# Patient Record
Sex: Female | Born: 1995 | Hispanic: No | Marital: Single | Smoking: Never smoker
Health system: Southern US, Community
[De-identification: ages and names within clinical notes are randomized; demographics above are authoritative.]

## PROBLEM LIST (undated history)

## (undated) DIAGNOSIS — S62101A Fracture of unspecified carpal bone, right wrist, initial encounter for closed fracture: Secondary | ICD-10-CM

## (undated) DIAGNOSIS — L309 Dermatitis, unspecified: Secondary | ICD-10-CM

## (undated) DIAGNOSIS — Z7189 Other specified counseling: Secondary | ICD-10-CM

## (undated) DIAGNOSIS — J020 Streptococcal pharyngitis: Secondary | ICD-10-CM

## (undated) DIAGNOSIS — R519 Headache, unspecified: Secondary | ICD-10-CM

## (undated) DIAGNOSIS — R51 Headache: Secondary | ICD-10-CM

## (undated) DIAGNOSIS — C73 Malignant neoplasm of thyroid gland: Secondary | ICD-10-CM

## (undated) DIAGNOSIS — C801 Malignant (primary) neoplasm, unspecified: Secondary | ICD-10-CM

## (undated) HISTORY — DX: Other specified counseling: Z71.89

## (undated) HISTORY — DX: Dermatitis, unspecified: L30.9

## (undated) HISTORY — DX: Streptococcal pharyngitis: J02.0

---
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
## 2016-05-26 ENCOUNTER — Other Ambulatory Visit: Payer: Self-pay | Admitting: Infectious Disease

## 2016-05-26 DIAGNOSIS — L0292 Furuncle, unspecified: Secondary | ICD-10-CM

## 2016-12-15 ENCOUNTER — Ambulatory Visit
Admission: RE | Admit: 2016-12-15 | Discharge: 2016-12-15 | Disposition: A | Discharge status: Home / Self Care | Payer: Medicaid Other | Source: Ambulatory Visit | Attending: Family Medicine | Admitting: Family Medicine

## 2016-12-15 ENCOUNTER — Other Ambulatory Visit: Payer: Self-pay | Admitting: Family Medicine

## 2016-12-15 DIAGNOSIS — R221 Localized swelling, mass and lump, neck: Secondary | ICD-10-CM

## 2016-12-15 MED ORDER — IOPAMIDOL (ISOVUE-300) INJECTION 61%
75.0000 mL | Freq: Once | INTRAVENOUS | Status: AC | PRN
  Administered 2016-12-15: 75 mL via INTRAVENOUS

## 2016-12-16 ENCOUNTER — Other Ambulatory Visit (HOSPITAL_COMMUNITY): Payer: Self-pay | Admitting: Family Medicine

## 2016-12-16 DIAGNOSIS — E042 Nontoxic multinodular goiter: Secondary | ICD-10-CM

## 2016-12-20 ENCOUNTER — Ambulatory Visit (HOSPITAL_COMMUNITY)
Admission: RE | Admit: 2016-12-20 | Discharge: 2016-12-20 | Disposition: A | Discharge status: Home / Self Care | Payer: BC Managed Care – PPO | Source: Ambulatory Visit | Attending: Family Medicine | Admitting: Family Medicine

## 2016-12-20 DIAGNOSIS — E042 Nontoxic multinodular goiter: Secondary | ICD-10-CM | POA: Insufficient documentation

## 2016-12-28 ENCOUNTER — Ambulatory Visit (HOSPITAL_COMMUNITY)
Admission: RE | Admit: 2016-12-28 | Discharge: 2016-12-28 | Disposition: A | Discharge status: Home / Self Care | Payer: BC Managed Care – PPO | Source: Ambulatory Visit | Attending: Family Medicine | Admitting: Family Medicine

## 2016-12-28 DIAGNOSIS — E042 Nontoxic multinodular goiter: Secondary | ICD-10-CM | POA: Insufficient documentation

## 2016-12-28 MED ORDER — LIDOCAINE HCL 1 % IJ SOLN
INTRAMUSCULAR | Status: AC
  Filled 2016-12-28: qty 20

## 2016-12-28 NOTE — Procedures (Signed)
US guided FNA X4 right inferior thyroid solid/cystic nodule and FNA x4 dominant left thyroid nodule via 25 gauge needles. Medication used- 1% lidocaine to skin/SQ tissue. No immediate complications. Final pathology pending.

## 2017-01-05 NOTE — H&P (Signed)
Otolaryngology Clinic Note  HPI:    Heather Glass is a 21 y.o. 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  She has had a somewhat thick lower neck for many years.  No fluctuation and no symptoms.  She had an infection after wisdom tooth extraction 2 years ago and feels like the area was slightly more full after that.  3 weeks ago, she noticed rather sudden increased swelling.  A CT scan of the neck showed a large left thyroid lesion, a possible left neck level 4 node, and a smaller right thyroid lesion.  Ultrasound showed the same.  Ultrasound-guided needle aspiration showed uncertain result on the right, papillary carcinoma on the left.  Outside of the appearance, she has no symptoms.  No pain.  No difficulty breathing or swallowing.  No hoarseness.  No other lumps or bumps in her neck.  She has a maternal grandmother who had thyroid cancer.  No history of radiation exposure.  She is otherwise completely healthy.  PMH/Meds/All/SocHx/FamHx/ROS:   History reviewed. No pertinent past medical history.  Past Surgical History:  Procedure Laterality Date  . biopsy thyroid    . MOUTH SURGERY      No family history of bleeding disorders, wound healing problems or difficulty with anesthesia.   Social History   Social History  . Marital status: N/A    Spouse name: N/A  . Number of children: N/A  . Years of education: N/A   Occupational History  . Not on file.   Social History Main Topics  . Smoking status: Never Smoker  . Smokeless tobacco: Never Used  . Alcohol use Not on file  . Drug use: Unknown  . Sexual activity: Not on file   Other Topics Concern  . Not on file   Social History Narrative  . No narrative on file     Current Outpatient Prescriptions:  .  calcium-vitamin D (OYSTER SHELL CALCIUM-VIT D3) 500 mg(1,250mg ) -200 unit per tablet, Take 2 tablets by mouth 2 times daily with meals for 10 days., Disp: 40 tablet, Rfl: 1 .   HYDROcodone-acetaminophen (NORCO) 5-325 mg per tablet, Take 1-2 tablets by mouth every 4 (four) hours as needed for up to 7 days for Pain., Disp: 30 tablet, Rfl: 0 .  levothyroxine (SYNTHROID) 100 MCG tablet, Take 1 tablet (100 mcg total) by mouth daily for 30 days., Disp: 30 tablet, Rfl: 5  A complete ROS was performed with pertinent positives/negatives noted in the HPI. The remainder of the ROS are negative.    Physical Exam:    BP 109/69 (Site: Left arm, Position: Sitting)   Ht 1.6 m (5\' 3" )   Wt 67.6 kg (149 lb)   BMI 26.39 kg/m  She is pleasant and healthy.  Mental status is appropriate.  She hears well in conversational speech.  Voice is clear and respirations unlabored through the nose.  The head is atraumatic and neck supple.  Cranial nerves intact.  Ear canals are clear with teeth in good repair.  External nose shows a complex deformity of the dorsum, and heavy buckling of the nasal septum into the right side with very prominent left maxillary crest spurring.  No polyps or active drainage.  Oral cavity shows teeth in excellent repair.  Oropharynx is clear.  I could not adequately evaluate her vocal cords with mirror examination.  Neck with a large moderately firm left thyroid mass.  Nontender.  No overlying skin changes.  No palpable adenopathy.  Negative Chvostek sign. Lungs: Clear to auscultation Heart: Regular rate and rhythm without murmurs Abdomen: Soft, active Extremities: Normal configuration Neurologic: Symmetric, grossly intact.  I will  Using the flexible laryngoscope, the nasopharynx is clear.  Oropharynx is clear.  Hypopharynx/larynx clear with mobile vocal cords and good airway.  No pooling in valleculae or piriform.  I could see well down the trachea from above and did not see any signs of tracheal invasion.    Flexible Laryngoscopy   Indications were discussed.  Details of the procedure were explained.  Questions were answered and informed consent was obtained  verbally.    Technique:  After anesthetizing the nasal cavity with topical lidocaine and oxymetazoline, the flexible endoscope was introduced and passed through the right nasal cavity into the nasopharynx. The scope was withdrawn from the nose. She tolerated the procedure well.     Impression & Plans:   Papillary carcinoma of the left thyroid.  Possible multifocal lesion of the right thyroid.  Left neck level 4 suspicious lymph node.  Distorted/deformed nasal dorsum and nasal septum with partial obstruction on each side.  Plan: She needs a total thyroidectomy and limited neck dissection.  I discussed the surgery in detail including risks and complications.  Questions were answered and informed consent was obtained.  Postoperative prescriptions for hydrocodone, Synthroid, and Os-Cal written and given.  I will see her back one week after surgery.  She can go back to class after 10 days, but no strenuous activities for 2 weeks.  I would like her to see Dr. Buddy Duty, endocrinologist, with attention to possible postoperative radioactive iodine.  I think he will be able to counsel her on whether this will have any impact on future fertility.  I will see her back here 1 week after the surgery.  Lilyan Gilford, MD  09/28/6999

## 2017-01-06 NOTE — Pre-Procedure Instructions (Signed)
Heather Glass  01/06/2017      [facility], [place] [address] Phone: [phone] Fax: [place], [place] [address] [place] [address] Phone: [phone] Fax: [phone]    Your procedure is scheduled on August 13  Report to [facility] at 1100 A.M.  Call this number if you have problems the morning of surgery:  [phone]   Remember:  Do not eat food or drink liquids after midnight.   Take these medicines the morning of surgery with A SIP OF WATER NONE  7 days prior to surgery STOP taking any Aspirin, Aleve, Naproxen, Ibuprofen, Motrin, Advil, Goody's, BC's, all herbal medications, fish oil, and all vitamins    Do not wear jewelry, make-up or nail polish.  Do not wear lotions, powders, or perfumes, or deoderant.  Do not shave 48 hours prior to surgery.  Men may shave face and neck.  Do not bring valuables to the hospital.  [facility] is not responsible for any belongings or valuables.  Contacts, dentures or bridgework may not be worn into surgery.  Leave your suitcase in the car.  After surgery it may be brought to your room.  For patients admitted to the hospital, discharge time will be determined by your treatment team.  Patients discharged the day of surgery will not be allowed to drive home.    Special instructions:   [facility]- Preparing For Surgery  Before surgery, you can play an important role. Because skin is not sterile, your skin needs to be as free of germs as possible. You can reduce the number of germs on your skin by washing with CHG (chlorahexidine gluconate) Soap before surgery.  CHG is an antiseptic cleaner which kills germs and bonds with the skin to continue killing germs even after washing.  Please do not use if you have an allergy to CHG or  antibacterial soaps. If your skin becomes reddened/irritated stop using the CHG.  Do not shave (including legs and underarms) for at least 48 hours prior to first CHG shower. It is OK to shave your face.  Please follow these instructions carefully.   1. Shower the NIGHT BEFORE SURGERY and the MORNING OF SURGERY with CHG.   2. If you chose to wash your hair, wash your hair first as usual with your normal shampoo.  3. After you shampoo, rinse your hair and body thoroughly to remove the shampoo.  4. Use CHG as you would any other liquid soap. You can apply CHG directly to the skin and wash gently with a scrungie or a clean washcloth.   5. Apply the CHG Soap to your body ONLY FROM THE NECK DOWN.  Do not use on open wounds or open sores. Avoid contact with your eyes, ears, mouth and genitals (private parts). Wash genitals (private parts) with your normal soap.  6. Wash thoroughly, paying special attention to the area where your surgery will be performed.  7. Thoroughly rinse your body with warm water from the neck down.  8. DO NOT shower/wash with your normal soap after using and rinsing off the CHG Soap.  9. Pat yourself dry with a CLEAN TOWEL.   10. Wear CLEAN PAJAMAS   11. Place CLEAN SHEETS on your bed the night of your first shower and DO NOT SLEEP WITH PETS.  Day of Surgery: Do not apply any deodorants/lotions. Please wear clean clothes to the hospital/surgery center.      Please read over the following fact sheets that you were given.

## 2017-01-07 ENCOUNTER — Ambulatory Visit (HOSPITAL_COMMUNITY)
Admission: RE | Admit: 2017-01-07 | Discharge: 2017-01-07 | Disposition: A | Discharge status: Home / Self Care | Payer: BC Managed Care – PPO | Source: Ambulatory Visit | Attending: Anesthesiology | Admitting: Anesthesiology

## 2017-01-07 ENCOUNTER — Encounter (HOSPITAL_COMMUNITY): Payer: Self-pay

## 2017-01-07 ENCOUNTER — Encounter (HOSPITAL_COMMUNITY)
Admission: RE | Admit: 2017-01-07 | Discharge: 2017-01-07 | Disposition: A | Discharge status: Home / Self Care | Payer: BC Managed Care – PPO | Source: Ambulatory Visit | Attending: Otolaryngology | Admitting: Otolaryngology

## 2017-01-07 DIAGNOSIS — Z01818 Encounter for other preprocedural examination: Secondary | ICD-10-CM | POA: Diagnosis present

## 2017-01-07 DIAGNOSIS — J9811 Atelectasis: Secondary | ICD-10-CM | POA: Diagnosis not present

## 2017-01-07 HISTORY — DX: Malignant (primary) neoplasm, unspecified: C80.1

## 2017-01-07 HISTORY — DX: Headache, unspecified: R51.9

## 2017-01-07 HISTORY — DX: Headache: R51

## 2017-01-07 HISTORY — DX: Fracture of unspecified carpal bone, right wrist, initial encounter for closed fracture: S62.101A

## 2017-01-07 HISTORY — DX: Malignant neoplasm of thyroid gland: C73

## 2017-01-07 LAB — CBC
HEMATOCRIT: 38.3 % (ref 36.0–46.0)
HEMOGLOBIN: 12.7 g/dL (ref 12.0–15.0)
MCH: 27.6 pg (ref 26.0–34.0)
MCHC: 33.2 g/dL (ref 30.0–36.0)
MCV: 83.3 fL (ref 78.0–100.0)
Platelets: 326 10*3/uL (ref 150–400)
RBC: 4.6 MIL/uL (ref 3.87–5.11)
RDW: 13.1 % (ref 11.5–15.5)
WBC: 10 10*3/uL (ref 4.0–10.5)

## 2017-01-07 LAB — COMPREHENSIVE METABOLIC PANEL
ALBUMIN: 3.9 g/dL (ref 3.5–5.0)
ALK PHOS: 43 U/L (ref 38–126)
ALT: 15 U/L (ref 14–54)
ANION GAP: 6 (ref 5–15)
AST: 18 U/L (ref 15–41)
BILIRUBIN TOTAL: 0.5 mg/dL (ref 0.3–1.2)
BUN: 10 mg/dL (ref 6–20)
CALCIUM: 9.1 mg/dL (ref 8.9–10.3)
CO2: 23 mmol/L (ref 22–32)
Chloride: 108 mmol/L (ref 101–111)
Creatinine, Ser: 0.6 mg/dL (ref 0.44–1.00)
GFR calc non Af Amer: >60 mL/min (ref >60)
Glucose, Bld: 85 mg/dL (ref 65–99)
POTASSIUM: 4 mmol/L (ref 3.5–5.1)
SODIUM: 137 mmol/L (ref 135–145)
TOTAL PROTEIN: 6.6 g/dL (ref 6.5–8.1)

## 2017-01-07 LAB — HCG, SERUM, QUALITATIVE: PREG SERUM: NEGATIVE

## 2017-01-07 NOTE — Progress Notes (Signed)
PCP - Dr. Penny Pia  Onc-Denies  Cardiologist - Denies  Chest x-ray - 01/07/17  EKG - Denies  Stress Test - Denies  ECHO - Denies  Cardiac Cath - Denies  Sleep Study - Denies CPAP - None   Pt denies having chest pain, sob, or fever at this time. All instructions explained to the pt, with a verbal understanding of the material. Pt agrees to go over the instructions while at home for a better understanding. The opportunity to ask questions was provided.

## 2017-01-10 ENCOUNTER — Observation Stay (HOSPITAL_COMMUNITY)
Admission: RE | Admit: 2017-01-10 | Discharge: 2017-01-11 | Disposition: A | Discharge status: Home / Self Care | Payer: BC Managed Care – PPO | Source: Ambulatory Visit | Attending: Otolaryngology | Admitting: Otolaryngology

## 2017-01-10 ENCOUNTER — Encounter (HOSPITAL_COMMUNITY)
Admission: RE | Disposition: A | Discharge status: Home / Self Care | Payer: Self-pay | Source: Ambulatory Visit | Attending: Otolaryngology

## 2017-01-10 ENCOUNTER — Ambulatory Visit (HOSPITAL_COMMUNITY): Payer: BC Managed Care – PPO | Admitting: Certified Registered Nurse Anesthetist

## 2017-01-10 ENCOUNTER — Encounter (HOSPITAL_COMMUNITY): Payer: Self-pay | Admitting: Surgery

## 2017-01-10 DIAGNOSIS — C73 Malignant neoplasm of thyroid gland: Principal | ICD-10-CM | POA: Diagnosis present

## 2017-01-10 DIAGNOSIS — E063 Autoimmune thyroiditis: Secondary | ICD-10-CM | POA: Diagnosis not present

## 2017-01-10 DIAGNOSIS — C770 Secondary and unspecified malignant neoplasm of lymph nodes of head, face and neck: Secondary | ICD-10-CM | POA: Diagnosis not present

## 2017-01-10 DIAGNOSIS — Z79899 Other long term (current) drug therapy: Secondary | ICD-10-CM | POA: Diagnosis not present

## 2017-01-10 HISTORY — PX: THYROIDECTOMY: SHX17

## 2017-01-10 SURGERY — THYROIDECTOMY
Anesthesia: General | Site: Neck

## 2017-01-10 MED ORDER — MIDAZOLAM HCL 2 MG/2ML IJ SOLN
INTRAMUSCULAR | Status: DC | PRN
  Administered 2017-01-10 (×2): 1 mg via INTRAVENOUS

## 2017-01-10 MED ORDER — KETOROLAC TROMETHAMINE 30 MG/ML IJ SOLN
INTRAMUSCULAR | Status: DC | PRN
  Administered 2017-01-10: 30 mg via INTRAVENOUS

## 2017-01-10 MED ORDER — ONDANSETRON HCL 4 MG/2ML IJ SOLN: 4.0000 mg | INTRAMUSCULAR | Status: DC | PRN

## 2017-01-10 MED ORDER — LIDOCAINE 2% (20 MG/ML) 5 ML SYRINGE
INTRAMUSCULAR | Status: AC
  Filled 2017-01-10: qty 5

## 2017-01-10 MED ORDER — ONDANSETRON HCL 4 MG PO TABS: 4.0000 mg | ORAL_TABLET | ORAL | Status: DC | PRN

## 2017-01-10 MED ORDER — DEXAMETHASONE SODIUM PHOSPHATE 10 MG/ML IJ SOLN
INTRAMUSCULAR | Status: DC | PRN
  Administered 2017-01-10: 5 mg via INTRAVENOUS

## 2017-01-10 MED ORDER — MIDAZOLAM HCL 2 MG/2ML IJ SOLN
INTRAMUSCULAR | Status: AC
  Filled 2017-01-10: qty 2

## 2017-01-10 MED ORDER — PROPOFOL 10 MG/ML IV BOLUS
INTRAVENOUS | Status: DC | PRN
  Administered 2017-01-10: 40 mg via INTRAVENOUS
  Administered 2017-01-10: 200 mg via INTRAVENOUS

## 2017-01-10 MED ORDER — NEOSTIGMINE METHYLSULFATE 5 MG/5ML IV SOSY
PREFILLED_SYRINGE | INTRAVENOUS | Status: AC
  Filled 2017-01-10: qty 5

## 2017-01-10 MED ORDER — CHLORHEXIDINE GLUCONATE CLOTH 2 % EX PADS: 6.0000 | MEDICATED_PAD | Freq: Once | CUTANEOUS | Status: DC

## 2017-01-10 MED ORDER — HYDROMORPHONE HCL 1 MG/ML IJ SOLN: 0.2500 mg | INTRAMUSCULAR | Status: DC | PRN

## 2017-01-10 MED ORDER — LIDOCAINE-EPINEPHRINE 1 %-1:100000 IJ SOLN
INTRAMUSCULAR | Status: AC
  Filled 2017-01-10: qty 1

## 2017-01-10 MED ORDER — EPHEDRINE 5 MG/ML INJ
INTRAVENOUS | Status: AC
  Filled 2017-01-10: qty 20

## 2017-01-10 MED ORDER — PHENYLEPHRINE HCL 10 MG/ML IJ SOLN
INTRAVENOUS | Status: DC | PRN
  Administered 2017-01-10: 20 ug/min via INTRAVENOUS

## 2017-01-10 MED ORDER — 0.9 % SODIUM CHLORIDE (POUR BTL) OPTIME
TOPICAL | Status: DC | PRN
  Administered 2017-01-10: 1000 mL

## 2017-01-10 MED ORDER — IBUPROFEN 100 MG/5ML PO SUSP
400.0000 mg | Freq: Four times a day (QID) | ORAL | Status: DC | PRN
  Administered 2017-01-10: 400 mg via ORAL
  Filled 2017-01-10 (×2): qty 20

## 2017-01-10 MED ORDER — CALCIUM CARBONATE-VITAMIN D 500-200 MG-UNIT PO TABS
2.0000 | ORAL_TABLET | Freq: Two times a day (BID) | ORAL | Status: DC
  Administered 2017-01-10 – 2017-01-11 (×2): 2 via ORAL
  Filled 2017-01-10 (×2): qty 2

## 2017-01-10 MED ORDER — ONDANSETRON HCL 4 MG/2ML IJ SOLN
INTRAMUSCULAR | Status: DC | PRN
  Administered 2017-01-10: 4 mg via INTRAVENOUS

## 2017-01-10 MED ORDER — DEXTROSE-NACL 5-0.45 % IV SOLN
INTRAVENOUS | Status: DC
  Administered 2017-01-10 – 2017-01-11 (×2): via INTRAVENOUS

## 2017-01-10 MED ORDER — ROCURONIUM BROMIDE 10 MG/ML (PF) SYRINGE
PREFILLED_SYRINGE | INTRAVENOUS | Status: AC
  Filled 2017-01-10: qty 5

## 2017-01-10 MED ORDER — HYDROCODONE-ACETAMINOPHEN 7.5-325 MG/15ML PO SOLN
10.0000 mL | ORAL | Status: DC | PRN
  Administered 2017-01-10 – 2017-01-11 (×4): 15 mL via ORAL
  Filled 2017-01-10 (×4): qty 15

## 2017-01-10 MED ORDER — ONDANSETRON HCL 4 MG/2ML IJ SOLN
INTRAMUSCULAR | Status: AC
  Filled 2017-01-10: qty 4

## 2017-01-10 MED ORDER — PROPOFOL 10 MG/ML IV BOLUS
INTRAVENOUS | Status: AC
  Filled 2017-01-10: qty 20

## 2017-01-10 MED ORDER — FENTANYL CITRATE (PF) 250 MCG/5ML IJ SOLN
INTRAMUSCULAR | Status: DC | PRN
  Administered 2017-01-10: 50 ug via INTRAVENOUS
  Administered 2017-01-10: 150 ug via INTRAVENOUS
  Administered 2017-01-10: 50 ug via INTRAVENOUS

## 2017-01-10 MED ORDER — BACITRACIN ZINC 500 UNIT/GM EX OINT
TOPICAL_OINTMENT | CUTANEOUS | Status: AC
Start: 2017-01-10 — End: 2017-01-10
  Filled 2017-01-10: qty 28.35

## 2017-01-10 MED ORDER — DEXAMETHASONE SODIUM PHOSPHATE 10 MG/ML IJ SOLN
INTRAMUSCULAR | Status: AC
  Filled 2017-01-10: qty 2

## 2017-01-10 MED ORDER — FENTANYL CITRATE (PF) 250 MCG/5ML IJ SOLN
INTRAMUSCULAR | Status: AC
  Filled 2017-01-10: qty 5

## 2017-01-10 MED ORDER — LIDOCAINE-EPINEPHRINE 1 %-1:100000 IJ SOLN
INTRAMUSCULAR | Status: DC | PRN
  Administered 2017-01-10: 6 mL

## 2017-01-10 MED ORDER — LIDOCAINE 2% (20 MG/ML) 5 ML SYRINGE
INTRAMUSCULAR | Status: DC | PRN
  Administered 2017-01-10: 60 mg via INTRAVENOUS

## 2017-01-10 MED ORDER — SUCCINYLCHOLINE CHLORIDE 20 MG/ML IJ SOLN
INTRAMUSCULAR | Status: DC | PRN
  Administered 2017-01-10: 100 mg via INTRAVENOUS

## 2017-01-10 MED ORDER — KETOROLAC TROMETHAMINE 30 MG/ML IJ SOLN
INTRAMUSCULAR | Status: AC
  Filled 2017-01-10: qty 1

## 2017-01-10 MED ORDER — LACTATED RINGERS IV SOLN
INTRAVENOUS | Status: DC
  Administered 2017-01-10: 12:00:00 via INTRAVENOUS

## 2017-01-10 SURGICAL SUPPLY — 53 items
ADH SKN CLS APL DERMABOND .7 (GAUZE/BANDAGES/DRESSINGS) ×1
APPLIER CLIP 9.375 SM OPEN (CLIP) ×3
APR CLP SM 9.3 20 MLT OPN (CLIP) ×1
ATTRACTOMAT 16X20 MAGNETIC DRP (DRAPES) ×3 IMPLANT
BLADE SURG 15 STRL LF DISP TIS (BLADE) ×1 IMPLANT
BLADE SURG 15 STRL SS (BLADE) ×3
CANISTER SUCT 3000ML PPV (MISCELLANEOUS) ×3 IMPLANT
CLEANER TIP ELECTROSURG 2X2 (MISCELLANEOUS) ×3 IMPLANT
CLIP APPLIE 9.375 SM OPEN (CLIP) ×1 IMPLANT
CONT SPEC 4OZ CLIKSEAL STRL BL (MISCELLANEOUS) ×8 IMPLANT
CORDS BIPOLAR (ELECTRODE) ×3 IMPLANT
COVER SURGICAL LIGHT HANDLE (MISCELLANEOUS) ×3 IMPLANT
CRADLE DONUT ADULT HEAD (MISCELLANEOUS) ×2 IMPLANT
DERMABOND ADVANCED (GAUZE/BANDAGES/DRESSINGS) ×2
DERMABOND ADVANCED .7 DNX12 (GAUZE/BANDAGES/DRESSINGS) ×1 IMPLANT
DRAIN SNY 10 ROU (WOUND CARE) ×2 IMPLANT
ELECT COATED BLADE 2.86 ST (ELECTRODE) ×3 IMPLANT
ELECT REM PT RETURN 9FT ADLT (ELECTROSURGICAL) ×3
ELECTRODE REM PT RTRN 9FT ADLT (ELECTROSURGICAL) ×1 IMPLANT
EVACUATOR SILICONE 100CC (DRAIN) ×3 IMPLANT
GAUZE SPONGE 4X4 16PLY XRAY LF (GAUZE/BANDAGES/DRESSINGS) IMPLANT
GLOVE BIOGEL PI IND STRL 6 (GLOVE) IMPLANT
GLOVE BIOGEL PI IND STRL 6.5 (GLOVE) IMPLANT
GLOVE BIOGEL PI INDICATOR 6 (GLOVE) ×2
GLOVE BIOGEL PI INDICATOR 6.5 (GLOVE) ×4
GLOVE ECLIPSE 8.0 STRL XLNG CF (GLOVE) ×3 IMPLANT
GLOVE SURG SS PI 6.5 STRL IVOR (GLOVE) ×2 IMPLANT
GOWN STRL REUS W/ TWL LRG LVL3 (GOWN DISPOSABLE) ×2 IMPLANT
GOWN STRL REUS W/ TWL XL LVL3 (GOWN DISPOSABLE) ×1 IMPLANT
GOWN STRL REUS W/TWL LRG LVL3 (GOWN DISPOSABLE) ×9
GOWN STRL REUS W/TWL XL LVL3 (GOWN DISPOSABLE) ×3
KIT BASIN OR (CUSTOM PROCEDURE TRAY) ×3 IMPLANT
KIT ROOM TURNOVER OR (KITS) ×3 IMPLANT
LOCATOR NERVE 3 VOLT (DISPOSABLE) IMPLANT
NDL HYPO 25GX1X1/2 BEV (NEEDLE) IMPLANT
NEEDLE HYPO 25GX1X1/2 BEV (NEEDLE) ×3 IMPLANT
NS IRRIG 1000ML POUR BTL (IV SOLUTION) ×3 IMPLANT
PAD ARMBOARD 7.5X6 YLW CONV (MISCELLANEOUS) ×6 IMPLANT
PENCIL BUTTON HOLSTER BLD 10FT (ELECTRODE) ×3 IMPLANT
SHEARS HARMONIC 9CM CVD (BLADE) ×3 IMPLANT
SPECIMEN JAR SMALL (MISCELLANEOUS) IMPLANT
SPONGE INTESTINAL PEANUT (DISPOSABLE) ×2 IMPLANT
STAPLER VISISTAT 35W (STAPLE) ×3 IMPLANT
SUT CHROMIC 3 0 PS 2 (SUTURE) IMPLANT
SUT CHROMIC 4 0 PS 2 18 (SUTURE) ×5 IMPLANT
SUT ETHILON 3 0 PS 1 (SUTURE) ×3 IMPLANT
SUT ETHILON 5 0 PS 2 18 (SUTURE) ×3 IMPLANT
SUT SILK 2 0 SH CR/8 (SUTURE) ×3 IMPLANT
SUT SILK 3 0 REEL (SUTURE) ×2 IMPLANT
TOWEL OR 17X24 6PK STRL BLUE (TOWEL DISPOSABLE) ×3 IMPLANT
TRAY ENT MC OR (CUSTOM PROCEDURE TRAY) ×3 IMPLANT
TUBE ENDOTRAC EMG 7X10.2 (MISCELLANEOUS) ×2 IMPLANT
WATER STERILE IRR 1000ML POUR (IV SOLUTION) ×3 IMPLANT

## 2017-01-10 NOTE — Progress Notes (Signed)
Patient arrived to 6N16  Alert and oriented, mild pain to mid neck. Incision to mid neck with skin glue and JP drain in center of incision. IV fluids running, placed on cont pulse ox. Patient oriented to room and staff, VSS, will continue to monitor.

## 2017-01-10 NOTE — Anesthesia Procedure Notes (Signed)
Procedure Name: Intubation Date/Time: 01/10/2017 1:20 PM Performed by: Wyatt Haste ASHLEY Pre-anesthesia Checklist: Timeout performed, Patient being monitored, Suction available, Emergency Drugs available and Patient identified Patient Re-evaluated:Patient Re-evaluated prior to induction Oxygen Delivery Method: Circle system utilized Preoxygenation: Pre-oxygenation with 100% oxygen Induction Type: IV induction and Rapid sequence Ventilation: Mask ventilation without difficulty Laryngoscope Size: Glidescope and 3 Grade View: Grade I Tube type: Oral (NIMS) Tube size: 7.0 mm Number of attempts: 1 Placement Confirmation: breath sounds checked- equal and bilateral,  positive ETCO2 and ETT inserted through vocal cords under direct vision Secured at: 22 cm Tube secured with: Tape Dental Injury: Teeth and Oropharynx as per pre-operative assessment

## 2017-01-10 NOTE — Transfer of Care (Signed)
Immediate Anesthesia Transfer of Care Note  Patient: Heather Glass  Procedure(s) Performed: Procedure(s): THYROIDECTOMY CENTRAL COMPARTMENT AND LEFT LEVEL 4 NECK DISECTION (N/A)  Patient Location: PACU  Anesthesia Type:General  Level of Consciousness: drowsy and patient cooperative  Airway & Oxygen Therapy: Patient Spontanous Breathing  Post-op Assessment: Report given to RN, Post -op Vital signs reviewed and stable and Patient moving all extremities X 4  Post vital signs: Reviewed and stable  Last Vitals:  Vitals:   01/10/17 1112 01/10/17 1603  BP: 124/87 137/81  Pulse: 81 (!) 109  Resp: 18 13  Temp: 36.6 C (!) 36.1 C  SpO2: 100% 97%    Last Pain:  Vitals:   01/10/17 1603  PainSc: (P) 0-No pain      Patients Stated Pain Goal: 0 (39/67/28 [id number])  Complications: No apparent anesthesia complications

## 2017-01-10 NOTE — Discharge Instructions (Signed)
Ice x 24 hrs Keep head elevated x 3-4 nights OK to shower beginning on 14 AUG Do not put any ointment on the skin glue.  OK to trim the corners as it begins to peel up. Diet as comfortable Alternate hydrocodone and ibuprofen for pain relief Recheck my office 3 weeks if all is well. [phone] for an appointment Back to school 10-14 days No strenuous activity x 14 days.  Call for wound swelling, twitching or tingling of your hands or lips, signs of infection. Take your calcium as soon as you can swallow it.  Take your synthroid as soon as you get home Call for an appointment with Dr. Buddy Duty, Endocrinologist, [phone] in the next few weeks.

## 2017-01-10 NOTE — Interval H&P Note (Signed)
History and Physical Interval Note:  01/10/2017 1:17 PM  Heather Glass  has presented today for surgery, with the diagnosis of Papillary Thyroid Cancer  The various methods of treatment have been discussed with the patient and family. After consideration of risks, benefits and other options for treatment, the patient has consented to  Procedure(s): THYROIDECTOMY CENTRAL COMPARTMENT AND LEFT LEVEL 4 NECK DISECTION (N/A) as a surgical intervention .  The patient's history has been re-reviewed, patient re-examined, no change in status, stable for surgery.  I have re-reviewed the patient's chart and labs.  Questions were answered to the patient's satisfaction.     Jodi Marble

## 2017-01-10 NOTE — Anesthesia Postprocedure Evaluation (Signed)
Anesthesia Post Note  Patient: Heather Glass  Procedure(s) Performed: Procedure(s) (LRB): THYROIDECTOMY CENTRAL COMPARTMENT AND LEFT LEVEL 4 NECK DISECTION (N/A)     Patient location during evaluation: PACU Anesthesia Type: General Level of consciousness: awake and alert Pain management: pain level controlled Vital Signs Assessment: post-procedure vital signs reviewed and stable Respiratory status: spontaneous breathing, nonlabored ventilation and respiratory function stable Cardiovascular status: blood pressure returned to baseline and stable Postop Assessment: no signs of nausea or vomiting Anesthetic complications: no    Last Vitals:  Vitals:   01/10/17 1648 01/10/17 1701  BP: 111/77 111/75  Pulse: (!) 110 (!) 106  Resp: 20 20  Temp:  36.6 C  SpO2: 98% 99%    Last Pain:  Vitals:   01/10/17 1633  PainSc: 0-No pain                 Genie Wenke,W. EDMOND

## 2017-01-10 NOTE — Progress Notes (Signed)
Patient ID: Heather Glass, female   DOB: 10/20/1995, 21 y.o.   MRN: [id number]  Post op check  Awake but sleepy. She is reluctant to try to talk but when she does she seems to have a voice.   Incision clean, dry and intact. No swelling or hematoma. JP with serosanguinous material.  Stable post op. Continue care.

## 2017-01-10 NOTE — Anesthesia Preprocedure Evaluation (Addendum)
Anesthesia Evaluation  Patient identified by MRN, date of birth, ID band Patient awake    Reviewed: Allergy & Precautions, H&P , NPO status , Patient's Chart, lab work & pertinent test results  Airway Mallampati: I  TM Distance: >3 FB Neck ROM: Full    Dental no notable dental hx. (+) Teeth Intact, Dental Advisory Given   Pulmonary neg pulmonary ROS,    Pulmonary exam normal breath sounds clear to auscultation       Cardiovascular negative cardio ROS   Rhythm:Regular Rate:Normal     Neuro/Psych  Headaches, negative psych ROS   GI/Hepatic negative GI ROS, Neg liver ROS,   Endo/Other  negative endocrine ROS  Renal/GU negative Renal ROS  negative genitourinary   Musculoskeletal   Abdominal   Peds  Hematology negative hematology ROS (+)   Anesthesia Other Findings   Reproductive/Obstetrics negative OB ROS                            Anesthesia Physical Anesthesia Plan  ASA: II  Anesthesia Plan: General   Post-op Pain Management:    Induction: Intravenous  PONV Risk Score and Plan: 4 or greater and Ondansetron, Dexamethasone, Midazolam and Propofol infusion  Airway Management Planned: Oral ETT  Additional Equipment:   Intra-op Plan:   Post-operative Plan: Extubation in OR  Informed Consent: I have reviewed the patients History and Physical, chart, labs and discussed the procedure including the risks, benefits and alternatives for the proposed anesthesia with the patient or authorized representative who has indicated his/her understanding and acceptance.   Dental advisory given  Plan Discussed with: CRNA  Anesthesia Plan Comments:         Anesthesia Quick Evaluation

## 2017-01-10 NOTE — Op Note (Signed)
01/10/2017  4:09 PM    Francee Gentile  [id number]   Pre-Op Dx:  Papillary thyroid cancer with LEFT neck metastatic node  Post-op Dx: same  Proc: Total thyroidectomy with central compartment dissection and LEFT level IV neck dissection   Surg:  Jodi Marble T MD  Asst:  Izora Gala MD  Anes:  GOT (NIMS tube)  EBL:  25 ml  Comp:   none  Findings:  5 cm LEFT thyroid mass with no gross extracapsular extension .  1.5 cm jnferior pole RIGHT thyroid mass.  2 x 1.5 x 1 cm LEFT level IV node  .  One RIGHT superior parathyroid identified and preserved.  Both recurrent laryngeal nerves identified and preserved.    Procedure: With the patient in a comfortable supine position, general orotracheal anesthesia was induced without difficulty using a Nims tube.  A shoulder roll was placed and the neck was extended and head supported. The patient was placed in a reverse Trendelenburg.  The identifying initials and the surgical marking were identified. A routine timeout was obtained.  The wound was infiltrated with 6 mL's of 1% Xylocaine with 1 100,000 epinephrine for intraoperative hemostasis.  A sterile preparation and draping of the entire anterior neck and upper chest was accomplished in the standard fashion.  An 8 cm transverse incision was sharply executed in the previously marked skin wrinkle. This was carried down through skin, subcutaneous fat, and platysma muscle. The midline raphae of the strap muscles was divided in a vertical direction. Large left thyroid mass was immediately beneath the platysma muscles. Strap muscles were dissected away from the thyroid on both sides. Wiedlaner retractors were used for wound exposure.    Beginning on the left side, the strap muscles were dissected away from the lateral aspect of the thyroid mass. Dissection was carried superiorly and inferiorly staying on the capsule of the gland. Thyroid vessels were identified and controlled superiorly and  inferiorly using the harmonic scalpel. The gland was rolled away from the trachea inferiorly and from the larynx superiorly. It was rolled medially. The recurrent laryngeal nerve was identified and preserved. Dissection was carried through [facility] ligament, through the isthmus, and the left lobe was excised and delivered for permanent pathologic interpretation.  Working on the right side, again the strap muscles were dissected from the lateral surface of the lobe and the dissection was carried around the superior and inferior poles superior and inferior thyroid vessels were controlled. A right superior parathyroid gland was identified and preserved. The lobe was rotated medially and working in the tracheoesophageal groove, the recurrent nerve was identified. The gland was dissected away from the nerve, carried off the trachea and delivered. This was sent as a separate specimen.  Recurrent nerve any sign was dissected inferiorly and working on the medial surface of the strap muscles, down to the carotid artery, and over to the trachea, the fatty soft tissues were dissected away from the nerve and down towards the sternal notch.  The same dissection was performed on the left side protecting the recurrent laryngeal nerve. These 2 dissections were communicated and the central compartment tissues were sent for pathologic interpretation.  Working medial to the strap muscles, the jugular vein was identified and the fascia the superior surface of the jugular vein was lysed and rolled medially towards the carotid artery. Fatty tissue and a palpable lymph node were dissected away from the medial surface of the jugular vein down into the supra clavicular fat and forwarded to the  carotid artery. This tissue was sent as a separate specimen containing a roughly 2 cm node or mat of nodes. Several structures felt significant for possible lymphatic vessels were controlled with silk ligature.  The wound was thoroughly  irrigated. Hemostasis was observed. No chyle leakage was noted including with Valsalva.    A 10 French round drain was placed into both thyroid beds and brought out through the midline. The strap muscles were approximated with a single stitch in the midline. The platysma muscles and subcutaneous fat were approximated with 4-0 chromic suture. The skin was reapproximated with 4-0 chromic. The surface wound was closed with Dermabond in a cosmetic fashion. Again hemostasis was observed. The drain was observed to be functioning.  At this point the procedure was completed. The patient was returned to anesthesia, awakened, extubated, and transferred to recovery in stable condition.   Dispo:   PACU to 6 N.  Plan:   Ice, elevation, analgesia, calcium monitoring. We'll await pathologic interpretation before deciding on possible radioactive iodine ablation.  Tyson Alias MD

## 2017-01-11 ENCOUNTER — Encounter (HOSPITAL_COMMUNITY): Payer: Self-pay | Admitting: Otolaryngology

## 2017-01-11 DIAGNOSIS — C73 Malignant neoplasm of thyroid gland: Secondary | ICD-10-CM | POA: Diagnosis not present

## 2017-01-11 LAB — CALCIUM
Calcium: 8.6 mg/dL — ABNORMAL LOW (ref 8.9–10.3)
Calcium: 8.8 mg/dL — ABNORMAL LOW (ref 8.9–10.3)

## 2017-01-11 MED FILL — LEVOTHYROXINE 100 MCG TABLE: 100 | 30 days supply | Qty: 30 | Fill #0

## 2017-01-11 MED FILL — HYDROCODON-APAP 5-325: 5-325 | 7 days supply | Qty: 30 | Fill #0

## 2017-01-11 NOTE — Progress Notes (Signed)
Pt discharged home with family. AVS reviewed with patient and family. All questions answered. Pt family stated that prescriptions were called into their pharmacy and are ready for pick-up. Blood pressure 112/61, pulse 76, temperature 98 F (36.7 C), temperature source Oral, resp. rate 17, height 5\' 3"  (1.6 m), weight 67.3 kg (148 lb 5.9 oz), last menstrual period 12/24/2016, SpO2 99 %.

## 2017-01-11 NOTE — Discharge Summary (Signed)
01/11/2017 8:50 AM  Heather Glass [id number]  Post-Op Day 1    Temp:  [97 F (36.1 C)-98.4 F (36.9 C)] 98 F (36.7 C) (08/14 0615) Pulse Rate:  [74-119] 74 (08/14 0615) Resp:  [13-20] 16 (08/14 0615) BP: (101-137)/(59-87) 101/65 (08/14 0615) SpO2:  [97 %-100 %] 99 % (08/14 0615),     Intake/Output Summary (Last 24 hours) at 01/11/17 0850 Last data filed at 01/11/17 0620  Gross per 24 hour  Intake          2046.33 ml  Output             1630 ml  Net           416.33 ml   Drain 80 ml  Results for orders placed or performed during the hospital encounter of 01/10/17 (from the past 24 hour(s))  Calcium     Status: Abnormal   Collection Time: 01/11/17 12:30 AM  Result Value Ref Range   Calcium 8.6 (L) 8.9 - 10.3 mg/dL    SUBJECTIVE:  Mod pain with movement and with swallowing.  Controlled.  Breathing well.  Sl cough with swallow, sl hoarse.  Neg N,V.  spont void.    OBJECTIVE:  Wound flat.  Voice use reluctant with whisper quality.  Neg Chvostek's.  Drain removed.    IMPRESSION:  Satisfactory check  PLAN:  Drain out and discharge home.  Will check AM Ca++.  I will call with Path reports.  Needs apptmnt with Dr. Buddy Duty.  Recheck my office 3 weeks.  Admit:  13 AUG Discharge:   14 AUG Final Diagnosis:  Papillary ca thyroid Proc:  Total thyroidectomy with central compartment and LEFT level IV neck dissection, 13 AUG Comp:  None Cond:  Good.  Ambulatory, voiding. Breathing, swallowing.  Pain controlled. Recheck:  3 wks. Pt to make apptment with Dr. Buddy Duty. Rx:  Hydrocodone, Synthroid, OsCal Instructions written and given  Hops Course:  Underwent surgery on day of admission.  Observed 23 hr overnight.  Ca++ at MN 8.6.  Voice raspy.  Pain controlled.  Swallowing liquds.  Drain removed and discharge home on AM POD 1.    Jodi Marble

## 2017-02-07 ENCOUNTER — Ambulatory Visit
Admission: RE | Admit: 2017-02-07 | Discharge: 2017-02-07 | Disposition: A | Discharge status: Home / Self Care | Payer: BC Managed Care – PPO | Source: Ambulatory Visit | Attending: Internal Medicine | Admitting: Internal Medicine

## 2017-02-07 ENCOUNTER — Other Ambulatory Visit: Payer: Self-pay | Admitting: Internal Medicine

## 2017-02-07 DIAGNOSIS — C73 Malignant neoplasm of thyroid gland: Secondary | ICD-10-CM

## 2017-02-08 MED FILL — LEVOTHYROXINE 112 MCG TAB: 112 | 30 days supply | Qty: 30 | Fill #0

## 2017-03-02 ENCOUNTER — Other Ambulatory Visit (HOSPITAL_COMMUNITY): Payer: Self-pay | Admitting: Internal Medicine

## 2017-03-02 DIAGNOSIS — C73 Malignant neoplasm of thyroid gland: Secondary | ICD-10-CM

## 2017-03-10 MED FILL — LEVOTHYROXINE 112 MCG TAB: 112 | 30 days supply | Qty: 30 | Fill #1

## 2017-03-16 ENCOUNTER — Ambulatory Visit (HOSPITAL_COMMUNITY)
Admission: RE | Admit: 2017-03-16 | Discharge: 2017-03-16 | Disposition: A | Discharge status: Home / Self Care | Payer: BC Managed Care – PPO | Source: Ambulatory Visit | Attending: Internal Medicine | Admitting: Internal Medicine

## 2017-03-16 DIAGNOSIS — C73 Malignant neoplasm of thyroid gland: Secondary | ICD-10-CM | POA: Diagnosis not present

## 2017-03-16 MED ORDER — THYROTROPIN ALFA 1.1 MG IM SOLR
0.9000 mg | INTRAMUSCULAR | Status: AC
  Administered 2017-03-16: 0.9 mg via INTRAMUSCULAR

## 2017-03-16 MED ORDER — STERILE WATER FOR INJECTION IJ SOLN
INTRAMUSCULAR | Status: AC
  Filled 2017-03-16: qty 10

## 2017-03-17 ENCOUNTER — Ambulatory Visit (HOSPITAL_COMMUNITY)
Admission: RE | Admit: 2017-03-17 | Discharge: 2017-03-17 | Disposition: A | Discharge status: Home / Self Care | Payer: BC Managed Care – PPO | Source: Ambulatory Visit | Attending: Internal Medicine | Admitting: Internal Medicine

## 2017-03-17 DIAGNOSIS — C73 Malignant neoplasm of thyroid gland: Secondary | ICD-10-CM | POA: Diagnosis present

## 2017-03-17 MED ORDER — THYROTROPIN ALFA 1.1 MG IM SOLR
0.9000 mg | INTRAMUSCULAR | Status: AC
  Administered 2017-03-17: 0.9 mg via INTRAMUSCULAR

## 2017-03-18 ENCOUNTER — Ambulatory Visit (HOSPITAL_COMMUNITY)
Admission: RE | Admit: 2017-03-18 | Discharge: 2017-03-18 | Disposition: A | Discharge status: Home / Self Care | Payer: BC Managed Care – PPO | Source: Ambulatory Visit | Attending: Internal Medicine | Admitting: Internal Medicine

## 2017-03-18 DIAGNOSIS — C73 Malignant neoplasm of thyroid gland: Secondary | ICD-10-CM | POA: Diagnosis present

## 2017-03-18 LAB — HCG, SERUM, QUALITATIVE: Preg, Serum: NEGATIVE

## 2017-03-18 MED ORDER — SODIUM IODIDE I 131 CAPSULE
125.0000 | Freq: Once | INTRAVENOUS | Status: AC | PRN
  Administered 2017-03-18: 125 via ORAL

## 2017-03-28 ENCOUNTER — Ambulatory Visit (HOSPITAL_COMMUNITY)
Admission: RE | Admit: 2017-03-28 | Discharge: 2017-03-28 | Disposition: A | Discharge status: Home / Self Care | Payer: BC Managed Care – PPO | Source: Ambulatory Visit | Attending: Internal Medicine | Admitting: Internal Medicine

## 2017-03-28 DIAGNOSIS — C73 Malignant neoplasm of thyroid gland: Secondary | ICD-10-CM | POA: Diagnosis present

## 2017-03-30 MED FILL — LEVOTHYROXINE 125 MCG TABLE: 125 | 30 days supply | Qty: 30 | Fill #0

## 2017-05-03 MED FILL — LEVOTHYROXINE 125 MCG TAB: 125 | 30 days supply | Qty: 30 | Fill #1

## 2017-05-28 ENCOUNTER — Other Ambulatory Visit: Payer: Self-pay | Admitting: Family Medicine

## 2017-05-28 DIAGNOSIS — N63 Unspecified lump in unspecified breast: Secondary | ICD-10-CM

## 2017-06-01 MED FILL — LEVOTHYROXINE 125 MCG TAB: 125 | 30 days supply | Qty: 30 | Fill #2

## 2017-06-09 ENCOUNTER — Other Ambulatory Visit: Payer: Self-pay

## 2017-07-04 MED FILL — LEVOTHYROXINE 125 MCG TAB: 125 | 30 days supply | Qty: 30 | Fill #3

## 2017-07-12 ENCOUNTER — Ambulatory Visit: Payer: Self-pay | Admitting: Internal Medicine

## 2017-07-13 ENCOUNTER — Other Ambulatory Visit: Payer: Self-pay | Admitting: Family Medicine

## 2017-07-13 ENCOUNTER — Other Ambulatory Visit (HOSPITAL_COMMUNITY)
Admission: RE | Admit: 2017-07-13 | Discharge: 2017-07-13 | Disposition: A | Discharge status: Home / Self Care | Payer: BC Managed Care – PPO | Source: Ambulatory Visit | Attending: Family Medicine | Admitting: Family Medicine

## 2017-07-13 DIAGNOSIS — N63 Unspecified lump in unspecified breast: Secondary | ICD-10-CM

## 2017-07-13 DIAGNOSIS — Z01419 Encounter for gynecological examination (general) (routine) without abnormal findings: Secondary | ICD-10-CM | POA: Diagnosis present

## 2017-07-15 LAB — CYTOLOGY - PAP: Diagnosis: NEGATIVE

## 2017-07-28 MED FILL — LEVOTHYROXINE 125 MCG TAB: 125 | 30 days supply | Qty: 30 | Fill #0

## 2017-09-01 MED FILL — LEVOTHYROXINE 125 MCG TAB: 125 | 30 days supply | Qty: 30 | Fill #1

## 2017-10-03 MED FILL — LEVOTHYROXINE 125 MCG TAB: 125 | 30 days supply | Qty: 30 | Fill #2

## 2017-10-20 ENCOUNTER — Ambulatory Visit: Payer: Self-pay | Admitting: Internal Medicine

## 2017-10-20 DIAGNOSIS — E890 Postprocedural hypothyroidism: Secondary | ICD-10-CM | POA: Insufficient documentation

## 2017-10-20 NOTE — Progress Notes (Deleted)
Patient ID: Heather Glass, female   DOB: 1995/07/27, 22 y.o.   MRN: [id number]    HPI  Heather Glass is a 22 y.o.-year-old female, referred by her previous endocrinologist, Dr. Buddy Duty, for management of thyroid cancer, postsurgical hypothyroidism, and postoperative hypocalcemia.  She had to transfer her care from Dr Buddy Duty due to her insurance.  Pt. has been dx with thyroid cancer in 11/2016.  She describes that last summer she started to feel onion with enlargement of her neck and she presented to see her PCP who ordered a neck CT which showed a large right thyroid nodule and abnormal appearing lymph nodes.  12/15/2016: Neck CT: There is a enlarged heterogeneous nodule largely replacing the left lobe of thyroid measuring approximately 3.9 x 3.3 x 5.3 cm (transverse by AP by craniocaudad). Subtle clot normal-appearing thyroid tissue surrounds this nodule, most evident medially on axial sequence (series 2, image 65). Single punctate calcifications centrally. Additional heterogeneous, predominantly hypodense 15 mm nodule present within the right lobe of thyroid (series 6, image 55).  Lymph nodes: Enlarged heterogeneous left level 4 lymph node adjacent to the enlarged left thyroid gland measures 11 mm in short axis (series 2, image 69). This node demonstrates similar signal characteristics as to the adjacent left thyroid nodule, raising concern for possible nodal metastatic disease. No other pathologically enlarged or concerning lymph nodes identified within the neck.  12/20/2016: Thyroid ultrasound: Bilateral thyroid nodules: Nodule # 1: Location: Right; Inferior Maximum size: 2.0 cm; Other 2 dimensions: 1.4 x 1.2 cm Composition: mixed cystic and solid (1) Echogenicity: cannot determine (1) Shape: taller-than-wide (3) _______________________________________________________  Nodule # 2:  Left thyroid lobe is replaced by this large nodule Location: Left; Mid Maximum size: 5.7 cm; Other  2 dimensions: 3.5 x 3.4 cm Composition: solid/almost completely solid (2) Echogenicity: cannot determine (1)  12/28/2016: FNAs of the dominant nodules: 5.7 cm left thyroid nodule: PAPILLARY CARCINOMA (BETHESDA CATEGORY VI).  2 cm solid/cystic right inferior nodule: ATYPIA OF UNDETERMINED SIGNIFICANCE OR FOLLICULAR LESION OF UNDETERMINED SIGNIFICANCE (BETHESDA CATEGORY III)  01/10/2017: Total thyroidectomy with central compartment and left level for dissection - Dr. Dagmar Hait 1. Thyroid, lobectomy, Left - PAPILLARY THYROID CARCINOMA, 5 CM. - MARGINS NOT INVOLVED. - CHRONIC LYMPHOCYTIC THYROIDITIS. 2. 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 - NO INVOLVEMENT BY MALIGNANCY. 3. Lymph nodes, regional resection, Central compartment - METASTATIC THYROID CARCINOMA IN TWO OF TWO LYMPH NODES (2/2). - BENIGN THYMUS GLAND. - BENIGN PARATHYROID GLAND. 4. Lymph nodes, regional resection, Left neck L 4 - METASTATIC THYROID CARCINOMA IN FOUR OF FOURTEEN LYMPH NODES (4/14).  1. THYROID Specimen: Thyroid and central compartment and left neck lymph nodes. Procedure (including lymph node sampling if applicable): Thyroidectomy and lymph node sampling. Specimen Integrity (intact/fragmented): Intact. Tumor focality: Unifocal. Dominant tumor: Left lobe. Maximum tumor size (cm): 5 cm. Tumor laterality: Left lobe. Histologic type (including subtype and/or unique features as applicable): Papillary thyroid carcinoma. Tumor capsule: N/A. Extrathyroidal extension: No. Capsular invasion with degree of invasion if present: N/A. Margins: Free of tumor. Lymphatic or vascular invasion: Present. Lymph nodes: # examined 16; # positive; 6. Extracapsular extension (if applicable): Focal extranodal extension. TNM code: pT3a, pN1b. Non-neoplastic thyroid: Chronic lymphocytic thyroiditis. Comments: The entire left lobe is involved by a 5 cm papillary thyroid  carcinoma which does not involve the margins of the specimen.  02/07/2017: Neck ultrasound: Complex cyst in the left thyroid bed measures up to 4.6 cm. Deep hypoechoic mass at the  midline measuring 0.9 cm. Superficial hypoechoic mass to the left of midline measuring 0.9 cm. These abnormalities are all nonspecific  02/07/2017: Thyroglobulin by IMA 0.4, ATA<1.0, TSH 2.23  03/16/2017: RAI treatment I-131 127.3 mCi  03/28/2017: Thyrogen stimulated whole-body scan: No metastases   Pt denies feeling nodules in neck, hoarseness, dysphagia/odynophagia, SOB with lying down.  Postsurgical hypothyroidism:  She is on Levothyroxine capsules (Tirosint) 125 mcg (increased from 112 mcg in 02/2017), taken: - fasting - with water - separated by >30 min from b'fast  - no calcium, iron, PPIs, multivitamins   I reviewed pt's thyroid tests: 03/29/2017: TSH 1.20, free T4 1.32 (0.61-1.12), after which her LT4 dose was increased from 112 to 125 mcg daily 02/07/2017: TSH 2.23, after which her LT4 dose was increased from 100 to 112 mcg daily 12/15/2016: TSH 1.67 No results found for: TSH, FREET4   Pt describes: - fatigue - weight gain - cold intolerance - constipation - dry skin - hair loss - depression  She has + FH of thyroid disorders in: ***. No FH of thyroid cancer.  No h/o radiation tx to head or neck.  Postsurgical hypocalcemia:  Reviewed calcium levels: 02/07/2017: Calcium 9.3 (8.6-10.3). Lab Results  Component Value Date   CALCIUM 8.8 (L) 01/11/2017   CALCIUM 8.6 (L) 01/11/2017   CALCIUM 9.1 01/07/2017   CALCIUM 9.3 06/18/2013    ROS: Constitutional: no weight gain/loss, no fatigue, no subjective hyperthermia/hypothermia Eyes: no blurry vision, no xerophthalmia ENT: no sore throat, no nodules palpated in throat, no dysphagia/odynophagia, no hoarseness Cardiovascular: no CP/SOB/palpitations/leg swelling Respiratory: no cough/SOB Gastrointestinal: no N/V/D/C Musculoskeletal: no  muscle/joint aches Skin: no rashes Neurological: no tremors/numbness/tingling/dizziness Psychiatric: no depression/anxiety  PE: There were no vitals taken for this visit. Wt Readings from Last 3 Encounters:  01/11/17 148 lb 5.9 oz (67.3 kg)  01/07/17 148 lb 4.8 oz (67.3 kg)  12/29/15 143 lb (64.9 kg) (74 %, Z= 0.63)*   * Growth percentiles are based on CDC (Girls, 2-20 Years) data.   Constitutional: overweight, in NAD Eyes: PERRLA, EOMI, no exophthalmos ENT: moist mucous membranes, no thyromegaly, no cervical lymphadenopathy Cardiovascular: RRR, No MRG Respiratory: CTA B Gastrointestinal: abdomen soft, NT, ND, BS+ Musculoskeletal: no deformities, strength intact in all 4 Skin: moist, warm, no rashes Neurological: no tremor with outstretched hands, DTR normal in all 4  ASSESSMENT: 1. Thyroid cancer - see HPI  2. Postsurgical Hypothyroidism  3.  Postoperative hypocalcemia.  PLAN:  1. Thyroid cancer - papillary - I had a long discussion with the patient about her fairly recent diagnosis of thyroid cancer. We reviewed together the pathology >> she is stage 1 TNM due to age, although the cancer was large, approximately 5 cm, did not have a capsule, had lymphovascular invasion and has already metastasized in 6 out of 16 lymph nodes.  She had extensive surgery with lymph node dissection.  She did develop what I hope will be transient postoperative hypocalcemia after her surgery. - I reassured her that papillary thyroid cancer is a slow growing cancer with good prognosis; her life expectancy is unlikely to be reduced due to the cancer.  - She had radioactive iodine ablation with Thyrogen stimulation versus hormonal withdrawal.  Posttreatment whole body scan did not show metastases, which is great. - We discussed that we will continue to follow her for now with TSH, Tg and ATA, and plan to do another whole body scan in a year after her previous - will check these today - She has  no neck  masses or pain/dysesthesia at the site of her surgical scar she had a cyst in the thyroid fossa per ultrasound obtained after her thyroidectomy.  This is not bothering her. - I will see her back in 6 months.  2. Patient with h/o total thyroidectomy for cancer, now with iatrogenic hypothyroidism, on levothyroxine therapy. She appears euthyroid.  - latest thyroid labs reviewed with pt >> normal  - she continues on LT4 125 mcg daily - pt feels good on this dose.  No hypo-or hyperthyroid symptoms. - we discussed about taking the thyroid hormone every day, with water, >30 minutes before breakfast, separated by >4 hours from acid reflux medications, calcium, iron, multivitamins. Pt. is taking it correctly. - will check thyroid tests today: TSH, free T4 - target TSH: LLN - If these are abnormal, she will need to return in 6-8 weeks for repeat labs - If these are normal, we will recheck them at next visit  3.  Postoperative hypocalcemia - Calcium normalized at last check by Dr. Buddy Duty, but will need to repeat this today.  I will also add a parathyroid hormone and a vitamin D level.  Philemon Kingdom, MD PhD [facility] Endocrinology

## 2017-11-02 MED FILL — LEVOTHYROXINE 125 MCG TAB: 125 | 30 days supply | Qty: 30 | Fill #0

## 2017-11-17 ENCOUNTER — Encounter: Payer: Self-pay | Admitting: Internal Medicine

## 2017-11-17 ENCOUNTER — Ambulatory Visit: Payer: BC Managed Care – PPO | Admitting: Internal Medicine

## 2017-11-17 VITALS — BP 110/80 | HR 91 | Ht 63.78 in | Wt 147.8 lb

## 2017-11-17 DIAGNOSIS — C73 Malignant neoplasm of thyroid gland: Secondary | ICD-10-CM

## 2017-11-17 DIAGNOSIS — E890 Postprocedural hypothyroidism: Secondary | ICD-10-CM

## 2017-11-17 DIAGNOSIS — E559 Vitamin D deficiency, unspecified: Secondary | ICD-10-CM

## 2017-11-17 NOTE — Progress Notes (Signed)
Patient ID: Heather Glass, female   DOB: [birth year], 22 y.o.   MRN: [id number]    HPI  Heather Glass is a pleasant 22 y.o.-year-old female, referred by her PCP, Dr. Lynelle Doctor for management of thyroid cancer and postsurgical hypothyroidism.  She is here with her mother who offers part of the history, especially regarding her diagnosis of thyroid cancer and her symptoms.  Pt. has been dx with thyroid cancer in 2018.   Reviewed cancer history:  Pt's mom noticed a lump in pt's neck >> saw PCP >> Thyroid U/S: large nodules bilaterally R: 2 cm nodule >> FNA AUS/FLUS L: 5.7 cm nodule >> FNA: PTC She had thyroidectomy with central node dissection by Dr. Erik Obey in 21/0240. 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   1. Thyroid, lobectomy, Left - PAPILLARY THYROID CARCINOMA, 5 CM. - MARGINS NOT INVOLVED. - CHRONIC LYMPHOCYTIC THYROIDITIS. 2. 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 - NO INVOLVEMENT BY MALIGNANCY. 3. Lymph nodes, regional resection, Central compartment - METASTATIC THYROID CARCINOMA IN TWO OF TWO LYMPH NODES (2/2). - BENIGN THYMUS GLAND. - BENIGN PARATHYROID GLAND. 4. Lymph nodes, regional resection, Left neck L 4 - METASTATIC THYROID CARCINOMA IN FOUR OF FOURTEEN LYMPH NODES (4/14).  Specimen: Thyroid and central compartment and left neck lymph nodes. Procedure (including lymph node sampling if applicable): Thyroidectomy and lymph node sampling. Specimen Integrity (intact/fragmented): Intact. Tumor focality: Unifocal. Dominant tumor: Left lobe. Maximum tumor size (cm): 5 cm. Tumor laterality: Left lobe. Histologic type (including subtype and/or unique features as applicable): Papillary thyroid carcinoma. Tumor capsule: N/A. Extrathyroidal extension: No. Capsular invasion with degree of invasion if present: N/A. Margins: Free of tumor. Lymphatic or  vascular invasion: Present. Lymph nodes: # examined 16; # positive; 6. Extracapsular extension (if applicable): Focal extranodal extension. TNM code: pT3a, pN1b. Non-neoplastic thyroid: Chronic lymphocytic thyroiditis. Comments: The entire left lobe is involved by a 5 cm papillary thyroid carcinoma which does not involve the margins of the specimen.  She developed swelling in her neck post op.  A neck ultrasound (02/07/2017) showed 2 cysts in the thyroid bed, 1 0.9 cm and one 4.6 cm.  She feels that these have resolved since then.    She saw Dr. Buddy Duty before who ordered RAI treatment (03/16/2017) with 127.3 mCi I-131.    Posttreatment whole body scan (03/28/2017) showed expected uptake in the thyroid bed, and no metastases.  Pt denies feeling nodules in neck, hoarseness, dysphagia/odynophagia, SOB with lying down.  She had postsurgical hypocalcemia: Lab Results  Component Value Date   CALCIUM 8.8 (L) 01/11/2017   CALCIUM 8.6 (L) 01/11/2017   CALCIUM 9.1 01/07/2017   CALCIUM 9.3 06/18/2013   She has a h/o vitamin D deficiency.  Not taking vitamin D now.  For her postsurgical hypothyroidism, she is on Levothyroxine 125 mcg, taken: - fasting - with water - separated by 20-30 min from b'fast  - no calcium, iron, PPIs, multivitamins  - + Biotin - last 4 weeks ago  No available thyroid tests for review: No results found for: TSH, FREET4   Pt denies; - fatigue - weight gain - cold intolerance - constipation - dry skin - hair loss - depression  But: - + anxiety  She had 1 episode of panic attack and palpitations. Saw PCP >> EKG normal. No heat intolerance, no weight loss.  She has + FH of thyroid disorders in: MGF, PGF. +  FH of thyroid cancer in PGGF.  No h/o radiation tx to head or neck.  ROS: Constitutional: + See HPI Eyes: no blurry vision, no xerophthalmia ENT: no sore throat, + see HPI Cardiovascular: no CP/SOB/+1 episode of palpitations/l no eg  swelling Respiratory: no cough/SOB Gastrointestinal: no N/V/D/C Musculoskeletal: no muscle/joint aches Skin: no rashes Neurological: no tremors/numbness/tingling/dizziness, + headaches Psychiatric: no depression/+ anxiety  Past Medical History:  Diagnosis Date  . Cancer ([place])   . Counseling on health promotion and disease prevention 10/02/2014  . Eczema   . Headache   . Papillary carcinoma of thyroid ([place])   . Strep pharyngitis   . Wrist fracture, right    Past Surgical History:  Procedure Laterality Date  . THYROIDECTOMY N/A 01/10/2017   Procedure: THYROIDECTOMY CENTRAL COMPARTMENT AND LEFT LEVEL 4 NECK DISECTION;  Surgeon: Jodi Marble, MD;  Location: [facility];  Service: ENT;  Laterality: N/A;   Social History   Socioeconomic History  . Marital status: Single    Spouse name: Not on file  . Number of children: 0  . Years of education: Not on file  . Highest education level: Not on file  Occupational History  .  College student  Tobacco Use  . Smoking status: Never Smoker  . Smokeless tobacco: Never Used  Substance and Sexual Activity  . Alcohol use: No  . Drug use: No   Allergies  Allergen Reactions  . Penicillins Hives    Has patient had a PCN reaction causing immediate rash, facial/tongue/throat swelling, SOB or lightheadedness with hypotension: Yes Has patient had a PCN reaction causing severe rash involving mucus membranes or skin necrosis: Yes Has patient had a PCN reaction that required hospitalization: Yes Has patient had a PCN reaction occurring within the last 10 years: No Childhood reaction If all of the above answers are "NO", then may proceed with Cephalosporin use.   Marland Kitchen Amoxicillin Hives  . Food Other (See Comments)    PEAS-RASH  . Pecan Nut (Diagnostic) Swelling and Rash    Throat swelling    Family History  Problem Relation Age of Onset  . Hypertension Father   . Diabetes Father   . Eczema Sister   . Diabetes Maternal Grandfather   .  Hypertension Maternal Grandfather   . Heart disease Maternal Grandfather   . Hypertension Paternal Grandmother   . Diabetes Paternal Grandfather    PE: BP 110/80   Pulse 91   Ht 5' 3.78" (1.62 m)   Wt 147 lb 12.8 oz (67 kg)   LMP 11/10/2017   SpO2 98%   BMI 25.55 kg/m  Wt Readings from Last 3 Encounters:  11/17/17 147 lb 12.8 oz (67 kg)  01/11/17 148 lb 5.9 oz (67.3 kg)  01/07/17 148 lb 4.8 oz (67.3 kg)   Constitutional: Normal weight, in NAD Eyes: PERRLA, EOMI, no exophthalmos ENT: moist mucous membranes, thyroidectomy scar healed, no cervical lymphadenopathy Cardiovascular: RRR, No MRG Respiratory: CTA B Gastrointestinal: abdomen soft, NT, ND, BS+ Musculoskeletal: no deformities, strength intact in all 4 Skin: moist, warm, no rashes Neurological: no tremor with outstretched hands, DTR normal in all 4  ASSESSMENT: 1. Thyroid cancer - see HPI  2. Postsurgical Hypothyroidism  3.  Postsurgical hypocalcemia  PLAN:  1. Thyroid cancer - papillary - I had a long discussion with the patient and her mother about her recent diagnosis of thyroid cancer. We reviewed together the pathology >> she is stage 3 TNM, however, due to age, she is stage 1. -  I reassured her that papillary thyroid cancer is a slow growing cancer with good prognosis; her life expectancy is unlikely to be reduced due to the cancer.  - since the tumor was so large, 5 cm, radioactive iodine was indicated for post-op thyroid remnant ablation and she had that in 02/2017. I explained that the main role of this is to facilitate monitoring in the long run (by checking thyroglobulin), but in her case, it also helped with the lymph nodes metastases.  A whole body scan after RAI treatment was negative for distant metastasis. - we will check a thyroglobulin and Tg antibodies today  - at next visit, in 6 months, will check another thyroglobulin and will also order most likely a neck ultrasound.  I would have liked to check a  Thyrogen stimulated whole body scan, however, this is very expensive for the patient and would like to avoid it. - I will see the patient back in 6 months  2. Patient with h/o total thyroidectomy for cancer, now with iatrogenic hypothyroidism, on levothyroxine therapy (125 mcg). She appears euthyroid.  - She does not appear to have thyroid nodules, enlarged cervical lymph nodes, or neck compression symptoms - We discussed about correct intake of levothyroxine, fasting, with water, separated by at least 30 minutes from breakfast, and separated by more than 4 hours from calcium, iron, multivitamins, acid reflux medications (PPIs). - will check thyroid tests today: TSH, free T4 - target TSH: LLN - If these are abnormal, she will need to return in 6-8 weeks for repeat labs - If these are normal, we will recheck them at next visit  3. Postsurgical hypocalcemia -She had slightly low calcium after surgery -She has a history of vitamin D deficiency  -We will check a calcium and vitamin D level today  Component     Latest Ref Rng & Units 11/18/2017 11/18/2017        10:42 AM 12:12 PM  Calcium     8.7 - 10.2 mg/dL 9.2 9.1  PTH, Intact     15 - 65 pg/mL CANCELED 14 (L)  Thyroglobulin     ng/mL 0.5 (L)   Comment         TSH     0.35 - 4.50 uIU/mL 0.22 (L)   T4,Free(Direct)     0.60 - 1.60 ng/dL 1.70 (H)   VITD     30.00 - 100.00 ng/mL 17.65 (L)   Thyroglobulin Ab     < or = 1 IU/mL <1    Vitamin D very low. Calcium normal, despite a low PTH. Will start 5000 units vitamin D daily and repeat labs in 2 mo. TSH suppressed with high free thyroid hormones >> will decrease LT4 to 112 mcg and recheck labs in 2 mo Tg low and ATA <1.  Philemon Kingdom, MD PhD [facility] Endocrinology

## 2017-11-17 NOTE — Patient Instructions (Signed)
Please come back for labs tomorrow.  Please continue Levothyroxine 125 mcg daily.  Take the thyroid hormone every day, with water, at least 30 minutes before breakfast, separated by at least 4 hours from: - acid reflux medications - calcium - iron - multivitamins  Please come back for a follow-up appointment in 6 months.

## 2017-11-18 ENCOUNTER — Other Ambulatory Visit: Payer: Self-pay | Admitting: Internal Medicine

## 2017-11-18 ENCOUNTER — Telehealth: Payer: Self-pay

## 2017-11-18 ENCOUNTER — Other Ambulatory Visit (INDEPENDENT_AMBULATORY_CARE_PROVIDER_SITE_OTHER): Payer: BC Managed Care – PPO

## 2017-11-18 DIAGNOSIS — C73 Malignant neoplasm of thyroid gland: Secondary | ICD-10-CM | POA: Diagnosis not present

## 2017-11-18 DIAGNOSIS — E890 Postprocedural hypothyroidism: Secondary | ICD-10-CM

## 2017-11-18 LAB — TSH: TSH: 0.22 u[IU]/mL — ABNORMAL LOW (ref 0.35–4.50)

## 2017-11-18 LAB — T4, FREE: Free T4: 1.7 ng/dL — ABNORMAL HIGH (ref 0.60–1.60)

## 2017-11-18 LAB — VITAMIN D 25 HYDROXY (VIT D DEFICIENCY, FRACTURES): VITD: 17.65 ng/mL — ABNORMAL LOW (ref 30.00–100.00)

## 2017-11-18 NOTE — Telephone Encounter (Signed)
Lab orders

## 2017-11-19 LAB — PTH, INTACT AND CALCIUM
CALCIUM: 9.1 mg/dL (ref 8.7–10.2)
PTH: 14 pg/mL — AB (ref 15–65)

## 2017-11-22 LAB — THYROGLOBULIN LEVEL: Thyroglobulin: 0.5 ng/mL — ABNORMAL LOW

## 2017-11-22 LAB — PTH, INTACT AND CALCIUM: CALCIUM: 9.2 mg/dL (ref 8.6–10.2)

## 2017-11-22 LAB — THYROGLOBULIN ANTIBODY

## 2017-11-24 MED FILL — LARIN 21 1-20 TABLET: 1-20 | 21 days supply | Qty: 21 | Fill #0

## 2017-11-25 MED ORDER — LEVOTHYROXINE SODIUM 112 MCG PO TABS: ORAL_TABLET | ORAL | 2 refills | Status: DC

## 2017-11-25 MED FILL — LEVOTHYROXINE 112 MCG TAB: 112 | 60 days supply | Qty: 60 | Fill #0

## 2018-01-06 MED FILL — LARIN 21 1-20 TABLET: 1-20 | 21 days supply | Qty: 21 | Fill #1

## 2018-01-16 MED FILL — LEVOTHYROXINE 112 MCG TAB: 112 | 60 days supply | Qty: 60 | Fill #1

## 2018-01-30 IMAGING — NM NM [ID] THYROID CANCER METS SP CA TX
6 series · 6 of 6 positions shown · non-contrast
Comparison: None.

CLINICAL DATA: Whole-body I 131 scan following treatment

EXAM:
NUCLEAR MEDICINE W-3Z3 POST THERAPY WHOLE BODY SCAN
TECHNIQUE: The patient received 127.3 mCi W-3Z3 sodium iodide for the treatment
of thyroid cancer within the past 10 days. The patient returns
today, and whole body scanning was performed in the anterior and
posterior projections.

[Series 1: marker · 4.14mm/px · 1 of 1 slices shown (1 of 2)]
[im 1/1]
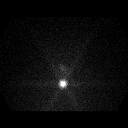

[Series 1: marker · 4.14mm/px · 1 of 1 slices shown (2 of 2)]
[im 1/1  full-range]
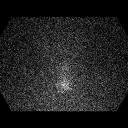

[Series 2: static thyroid no marker · 4.14mm/px · 1 of 1 slices shown (1 of 2)]
[im 1/1  full-range]
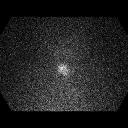

[Series 2: static thyroid no marker · 4.14mm/px · 1 of 1 slices shown (2 of 2)]
[im 1/1  full-range]
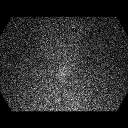

[Series 3: i131 whole body · 2.66mm/px · 1 of 1 slices shown (1 of 2)]
[im 1/1  full-range]
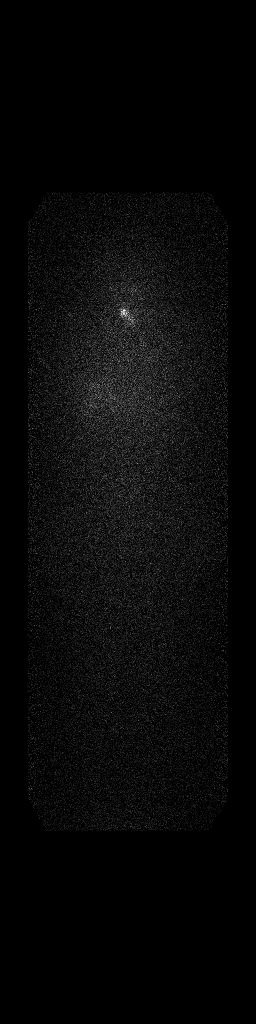

[Series 3: i131 whole body · 2.66mm/px · 1 of 1 slices shown (2 of 2)]
[im 1/1  full-range]
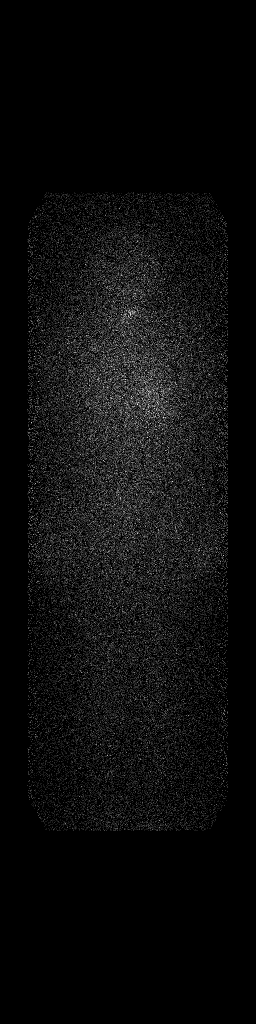

[6 of 6 positions shown; findings below may reference images not displayed]

FINDINGS: Residual activity is noted in the region of the thyroid bed. No
adjacent lymph nodes are identified. Some expected activity noted in
the liver which could be due to fatty infiltration. No findings to
suggest metastatic disease.
IMPRESSION: Residual thyroid tissue in the thyroid bed but no findings for
metastatic disease.

## 2018-03-27 MED FILL — LEVOTHYROXINE 112 MCG TAB: 112 | 60 days supply | Qty: 60 | Fill #2

## 2018-05-19 ENCOUNTER — Ambulatory Visit: Payer: Self-pay | Admitting: Internal Medicine

## 2018-05-19 DIAGNOSIS — Z0289 Encounter for other administrative examinations: Secondary | ICD-10-CM

## 2018-05-19 DIAGNOSIS — E559 Vitamin D deficiency, unspecified: Secondary | ICD-10-CM | POA: Insufficient documentation

## 2018-05-19 NOTE — Progress Notes (Deleted)
Patient ID: Heather Glass, female   DOB: 1996-02-08, 22 y.o.   MRN: [id number]    HPI  Heather Glass is a pleasant 22 y.o.-year-old female, initially referred by her PCP, Dr. Lynelle Doctor, returning for follow-up for of thyroid cancer and postsurgical hypothyroidism and hypocalcemia.  She is here with her mother who offers part of the history, especially regarding her symptoms and medication dosing.  Pt. has been dx with thyroid cancer in 2018.  Reviewed her cancer history:  Pt's mom noticed a lump in pt's neck >> saw PCP >> Thyroid U/S: large nodules bilaterally R: 2 cm nodule >> FNA AUS/FLUS L: 5.7 cm nodule >> FNA: PTC She had thyroidectomy with central node dissection by Dr. Erik Obey in 49/1791. 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   1. Thyroid, lobectomy, Left - PAPILLARY THYROID CARCINOMA, 5 CM. - MARGINS NOT INVOLVED. - CHRONIC LYMPHOCYTIC THYROIDITIS. 2. 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 - NO INVOLVEMENT BY MALIGNANCY. 3. Lymph nodes, regional resection, Central compartment - METASTATIC THYROID CARCINOMA IN TWO OF TWO LYMPH NODES (2/2). - BENIGN THYMUS GLAND. - BENIGN PARATHYROID GLAND. 4. Lymph nodes, regional resection, Left neck L 4 - METASTATIC THYROID CARCINOMA IN FOUR OF FOURTEEN LYMPH NODES (4/14).  Specimen: Thyroid and central compartment and left neck lymph nodes. Procedure (including lymph node sampling if applicable): Thyroidectomy and lymph node sampling. Specimen Integrity (intact/fragmented): Intact. Tumor focality: Unifocal. Dominant tumor: Left lobe. Maximum tumor size (cm): 5 cm. Tumor laterality: Left lobe. Histologic type (including subtype and/or unique features as applicable): Papillary thyroid carcinoma. Tumor capsule: N/A. Extrathyroidal extension: No. Capsular invasion with degree of invasion if present: N/A. Margins:  Free of tumor. Lymphatic or vascular invasion: Present. Lymph nodes: # examined 16; # positive; 6. Extracapsular extension (if applicable): Focal extranodal extension. TNM code: pT3a, pN1b. Non-neoplastic thyroid: Chronic lymphocytic thyroiditis. Comments: The entire left lobe is involved by a 5 cm papillary thyroid carcinoma which does not involve the margins of the specimen.  She developed swelling in her neck post op.  A neck ultrasound (02/07/2017) showed 2 cysts in the thyroid bed, 1 0.9 cm and one 4.6 cm.  She feels that these have resolved since then.    She saw Dr. Buddy Duty before who ordered RAI treatment (03/16/2017) with 127.3 mCi I-131.    Posttreatment whole body scan (03/28/2017): Expected uptake in the thyroid bed, but no metastasis.  This was an expensive test for the patient.  Thyroglobulin was detectable, but low at last visit: Lab Results  Component Value Date   THYROGLB 0.5 (L) 11/18/2017   THGAB <1 11/18/2017   Pt denies: - feeling nodules in neck - hoarseness - dysphagia - choking - SOB with lying down  She developed postsurgical hypoparathyroidism and hypocalcemia: Lab Results  Component Value Date   PTH 14 (L) 11/18/2017   PTH Comment 11/18/2017   PTH CANCELED 11/18/2017   CALCIUM 9.1 11/18/2017   CALCIUM 9.2 11/18/2017   CALCIUM 8.8 (L) 01/11/2017   CALCIUM 8.6 (L) 01/11/2017   CALCIUM 9.1 01/07/2017   CALCIUM 9.3 06/18/2013   She is not taking calcium supplements.  She has a history of vitamin D deficiency.  At last visit, we checked this and it was quite low: Lab Results  Component Value Date   VD25OH 17.65 (L) 11/18/2017   I advised her to start 5000 units vitamin D daily.  Postsurgical hypothyroidism:  Pt is on levothyroxine 125 >> 112 mcg daily, taken: - in am - fasting - at least 30 min from b'fast - no Ca, Fe, MVI, PPIs - +  on Biotin -nothing last week  TFTs reviewed-TSH was low so we decreased the dose of her levothyroxine at  last visit: Lab Results  Component Value Date   TSH 0.22 (L) 11/18/2017   FREET4 1.70 (H) 11/18/2017    Patient denies: - Weight gain - Fatigue - Cold intolerance - Constipation - Hair loss  But she does have anxiety.  Before last visit she had 1 episode of panic attack and palpitations. Saw PCP >> EKG normal. No heat intolerance, no weight loss.  She has + FH of thyroid disorders in: MGF, PGF.  She has a history of thyroid cancer in paternal great grandfather. No history of radiation to head or neck except for RAI treatment.  ROS: Constitutional: no weight gain/no weight loss, no fatigue, no subjective hyperthermia, no subjective hypothermia Eyes: no blurry vision, no xerophthalmia ENT: no sore throat, + see HPI Cardiovascular: no CP/no SOB/no palpitations/no leg swelling Respiratory: no cough/no SOB/no wheezing Gastrointestinal: no N/no V/no D/no C/no acid reflux Musculoskeletal: no muscle aches/no joint aches Skin: no rashes, no hair loss Neurological: no tremors/no numbness/no tingling/no dizziness  I reviewed pt's medications, allergies, PMH, social hx, family hx, and changes were documented in the history of present illness. Otherwise, unchanged from my initial visit note.  Past Medical History:  Diagnosis Date  . Cancer ([place])   . Counseling on health promotion and disease prevention 10/02/2014  . Eczema   . Headache   . Papillary carcinoma of thyroid ([place])   . Strep pharyngitis   . Wrist fracture, right    Past Surgical History:  Procedure Laterality Date  . THYROIDECTOMY N/A 01/10/2017   Procedure: THYROIDECTOMY CENTRAL COMPARTMENT AND LEFT LEVEL 4 NECK DISECTION;  Surgeon: Jodi Marble, MD;  Location: [facility];  Service: ENT;  Laterality: N/A;   Social History   Socioeconomic History  . Marital status: Single    Spouse name: Not on file  . Number of children: 0  . Years of education: Not on file  . Highest education level: Not on file  Occupational History   .  College student  Tobacco Use  . Smoking status: Never Smoker  . Smokeless tobacco: Never Used  Substance and Sexual Activity  . Alcohol use: No  . Drug use: No   Allergies  Allergen Reactions  . Penicillins Hives    Has patient had a PCN reaction causing immediate rash, facial/tongue/throat swelling, SOB or lightheadedness with hypotension: Yes Has patient had a PCN reaction causing severe rash involving mucus membranes or skin necrosis: Yes Has patient had a PCN reaction that required hospitalization: Yes Has patient had a PCN reaction occurring within the last 10 years: No Childhood reaction If all of the above answers are "NO", then may proceed with Cephalosporin use.   Marland Kitchen Amoxicillin Hives  . Food Other (See Comments)    PEAS-RASH  . Pecan Nut (Diagnostic) Swelling and Rash    Throat swelling    Family History  Problem Relation Age of Onset  . Hypertension Father   . Diabetes Father   . Eczema Sister   . Diabetes Maternal Grandfather   . Hypertension Maternal Grandfather   . Heart disease Maternal Grandfather   . Hypertension Paternal Grandmother   . Diabetes Paternal Grandfather    PE: There were no vitals taken for  this visit. Wt Readings from Last 3 Encounters:  11/17/17 147 lb 12.8 oz (67 kg)  01/11/17 148 lb 5.9 oz (67.3 kg)  01/07/17 148 lb 4.8 oz (67.3 kg)   Constitutional: Normal weight, in NAD Eyes: PERRLA, EOMI, no exophthalmos ENT: moist mucous membranes, no neck masses palpated, thyroidectomy scar healed, no cervical lymphadenopathy Cardiovascular: RRR, No MRG Respiratory: CTA B Gastrointestinal: abdomen soft, NT, ND, BS+ Musculoskeletal: no deformities, strength intact in all 4 Skin: moist, warm, no rashes Neurological: no tremor with outstretched hands, DTR normal in all 4  ASSESSMENT: 1. Thyroid cancer - see HPI  2. Postsurgical Hypothyroidism  3.  Postsurgical hypocalcemia  PLAN:  1. Thyroid cancer - papillary -We discussed that  she is stage III TNM based on pathology, however, due to age, she is clinical stage I -I reassured her again that her papillary cancer is a slow-growing cancer with good prognosis. -Since the tumor was large, 5 cm, RAI was indicated and she had radioactive iodine remnant ablation in 02/2017.  We checked a posttreatment whole-body scan and this only showed absorption in the thyroid foci, not in the metastatic lymph nodes. -She is now 1 year out after her treatment so we would need to repeat a whole-body scan.  However, this is very expensive for patient and we discussed about checking a neck ultrasound instead. -Today we will also check a thyroglobulin and thyroglobulin antibodies. -I will see her back in a year  2. Patient with history of total thyroidectomy for thyroid cancer, now with iatrogenic hypothyroidism, on levothyroxine. -She appears euthyroid - latest thyroid labs reviewed with pt >> slightly low TSH.  We decreased her levothyroxine dose at last visit but she did not return for labs... - she continues on LT4 112 Mcg daily, dose decreased at last visit. - pt feels good on this dose. - we discussed about taking the thyroid hormone every day, with water, >30 minutes before breakfast, separated by >4 hours from acid reflux medications, calcium, iron, multivitamins. Pt. is taking it correctly. - will check thyroid tests today: TSH and fT4 - If labs are abnormal, she will need to return for repeat TFTs in 1.5 months  3. Postsurgical hypocalcemia - She has slightly low calcium after the surgery, but normalized afterwards.  PTH was low at last visit, pointing towards mild postsurgical hypoparathyroidism. - However, she also had a very low vitamin D at last visit, at 17.65, which we are correcting - Repeat calcium and vitamin D today - No perioral numbness or sacral cramping   Philemon Kingdom, MD PhD [facility] Endocrinology

## 2018-05-25 ENCOUNTER — Other Ambulatory Visit: Payer: Self-pay | Admitting: Internal Medicine

## 2018-05-25 MED FILL — LEVOTHYROXINE 112 MCG TAB: 112 | 60 days supply | Qty: 60 | Fill #0

## 2018-05-30 ENCOUNTER — Other Ambulatory Visit (INDEPENDENT_AMBULATORY_CARE_PROVIDER_SITE_OTHER): Payer: BC Managed Care – PPO

## 2018-05-30 DIAGNOSIS — E559 Vitamin D deficiency, unspecified: Secondary | ICD-10-CM | POA: Diagnosis not present

## 2018-05-30 DIAGNOSIS — E890 Postprocedural hypothyroidism: Secondary | ICD-10-CM | POA: Diagnosis not present

## 2018-05-30 LAB — TSH: TSH: 0.69 u[IU]/mL (ref 0.35–4.50)

## 2018-05-30 LAB — T4, FREE: FREE T4: 1.5 ng/dL (ref 0.60–1.60)

## 2018-05-30 LAB — VITAMIN D 25 HYDROXY (VIT D DEFICIENCY, FRACTURES): VITD: 37.77 ng/mL (ref 30.00–100.00)

## 2018-06-02 ENCOUNTER — Telehealth: Payer: Self-pay

## 2018-06-02 NOTE — Telephone Encounter (Signed)
-----   Message from Philemon Kingdom, MD sent at 06/01/2018 12:19 PM EST ----- Lenna Sciara, can you please call pt:   TFTs and vit D now normal.

## 2018-06-02 NOTE — Telephone Encounter (Signed)
Gave patient results and she stated an understanding- patient had no questions at this time

## 2018-07-25 MED FILL — LEVOTHYROXINE 112 MCG TAB: 112 | 60 days supply | Qty: 60 | Fill #1 | Status: TO

## 2018-08-28 ENCOUNTER — Other Ambulatory Visit: Payer: Self-pay

## 2018-08-29 ENCOUNTER — Other Ambulatory Visit: Payer: Self-pay

## 2018-08-29 ENCOUNTER — Ambulatory Visit (INDEPENDENT_AMBULATORY_CARE_PROVIDER_SITE_OTHER): Payer: BC Managed Care – PPO | Admitting: Internal Medicine

## 2018-08-29 DIAGNOSIS — C73 Malignant neoplasm of thyroid gland: Secondary | ICD-10-CM

## 2018-08-29 DIAGNOSIS — E890 Postprocedural hypothyroidism: Secondary | ICD-10-CM

## 2018-08-29 DIAGNOSIS — E559 Vitamin D deficiency, unspecified: Secondary | ICD-10-CM

## 2018-08-29 MED ORDER — LEVOTHYROXINE SODIUM 112 MCG PO TABS: ORAL_TABLET | ORAL | 3 refills | Status: DC

## 2018-08-29 NOTE — Progress Notes (Signed)
Patient ID: Heather Glass, female   DOB: 1995-12-15, 23 y.o.   MRN: [id number]   Patient location: Home My location: Office  Referring Provider: ViaLennette Bihari, MD  I connected with the patient on 08/29/18 at 10:00 AM EDT by a video enabled telemedicine application and verified that I am speaking with the correct person.   I discussed the limitations of evaluation and management by telemedicine and the availability of in person appointments. The patient expressed understanding and agreed to proceed.   Details of the encounter are shown below.  HPI  Heather Glass is a pleasant 23 y.o.-year-old female, initially referred by her PCP, Dr. Via presenting for follow-up for thyroid cancer, postsurgical hypothyroidism, postsurgical hypocalcemia, vitamin D deficiency.  Last visit 9 months ago.  Thyroid cancer in 2018.  Reviewed her cancer history:  Pt's mom noticed a lump in pt's neck >> saw PCP >> Thyroid U/S: large nodules bilaterally R: 2 cm nodule >> FNA AUS/FLUS L: 5.7 cm nodule >> FNA: PTC She had thyroidectomy with central node dissection by Dr. Erik Obey in 40/1027. 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   1. Thyroid, lobectomy, Left - PAPILLARY THYROID CARCINOMA, 5 CM. - MARGINS NOT INVOLVED. - CHRONIC LYMPHOCYTIC THYROIDITIS. 2. 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 - NO INVOLVEMENT BY MALIGNANCY. 3. Lymph nodes, regional resection, Central compartment - METASTATIC THYROID CARCINOMA IN TWO OF TWO LYMPH NODES (2/2). - BENIGN THYMUS GLAND. - BENIGN PARATHYROID GLAND. 4. Lymph nodes, regional resection, Left neck L 4 - METASTATIC THYROID CARCINOMA IN FOUR OF FOURTEEN LYMPH NODES (4/14).  Specimen: Thyroid and central compartment and left neck lymph nodes. Procedure (including lymph node sampling if applicable): Thyroidectomy and lymph node  sampling. Specimen Integrity (intact/fragmented): Intact. Tumor focality: Unifocal. Dominant tumor: Left lobe. Maximum tumor size (cm): 5 cm. Tumor laterality: Left lobe. Histologic type (including subtype and/or unique features as applicable): Papillary thyroid carcinoma. Tumor capsule: N/A. Extrathyroidal extension: No. Capsular invasion with degree of invasion if present: N/A. Margins: Free of tumor. Lymphatic or vascular invasion: Present. Lymph nodes: # examined 16; # positive; 6. Extracapsular extension (if applicable): Focal extranodal extension. TNM code: pT3a, pN1b. Non-neoplastic thyroid: Chronic lymphocytic thyroiditis. Comments: The entire left lobe is involved by a 5 cm papillary thyroid carcinoma which does not involve the margins of the specimen.  She developed swelling in her neck.  A neck ultrasound (02/07/2017) showed 2 cysts in the thyroid bed, 1 0.9 cm and one 4.6 cm.  She feels that these have resolved since then.  She saw Dr. Buddy Duty before who ordered RAI treatment (03/16/2017) with 127.3 mCi I-131.    Posttreatment whole body scan (03/28/2017) showed expected uptake in the thyroid bed, and no metastases.  Reviewed her latest thyroglobulin and thyroglobulin antibodies: Lab Results  Component Value Date   THYROGLB 0.5 (L) 11/18/2017   THGAB <1 11/18/2017   Postsurgical hypocalcemia: Lab Results  Component Value Date   PTH 14 (L) 11/18/2017   PTH Comment 11/18/2017   PTH CANCELED 11/18/2017   CALCIUM 9.1 11/18/2017   CALCIUM 9.2 11/18/2017   CALCIUM 8.8 (L) 01/11/2017   CALCIUM 8.6 (L) 01/11/2017   CALCIUM 9.1 01/07/2017   CALCIUM 9.3 06/18/2013   She also has vitamin D deficiency: Lab Results  Component Value Date   VD25OH 37.77 05/30/2018   VD25OH 17.65 (L) 11/18/2017   We started vitamin D 5000 units daily and  her vitamin D level normalized in 04/2018.  Postsurgical hypothyroidism:  Pt is on levothyroxine 112 mcg daily (dose decreased  10/2017), taken: - in am - fasting - at least 30 min from b'fast - no Ca, Fe,  PPIs - + on Biotin - occasionally - + started Prenatal vitamins - 1h after LT4  Reviewed her TFTs: Lab Results  Component Value Date   TSH 0.69 05/30/2018   TSH 0.22 (L) 11/18/2017   FREET4 1.50 05/30/2018   FREET4 1.70 (H) 11/18/2017    Pt denies: - feeling nodules in neck - hoarseness - dysphagia - choking - SOB with lying down  She had 1 episode of panic attack and palpitations. Saw PCP >> EKG normal.  She denies heat intolerance and weight loss  She has + FH of thyroid disorders in: MGF, PGF.  + FH of thyroid cancer in PGGF. No h/o radiation tx to head or neck other than RAI treatment.  No herbal supplements. + Biotin use. No recent steroids use.   ROS: Constitutional: no weight gain/no weight loss, no fatigue, no subjective hyperthermia, no subjective hypothermia Eyes: no blurry vision, no xerophthalmia ENT: no sore throat, + see HPI Cardiovascular: no CP/no SOB/no palpitations/no leg swelling Respiratory: no cough/no SOB/no wheezing Gastrointestinal: no N/no V/no D/no C/no acid reflux Musculoskeletal: no muscle aches/no joint aches Skin: no rashes, + dry skin, no hair loss Neurological: no tremors/no numbness/no tingling/no dizziness + increased mood changes  I reviewed pt's medications, allergies, PMH, social hx, family hx, and changes were documented in the history of present illness. Otherwise, unchanged from my initial visit note.  Past Medical History:  Diagnosis Date  . Cancer ([place])   . Counseling on health promotion and disease prevention 10/02/2014  . Eczema   . Headache   . Papillary carcinoma of thyroid ([place])   . Strep pharyngitis   . Wrist fracture, right    Past Surgical History:  Procedure Laterality Date  . THYROIDECTOMY N/A 01/10/2017   Procedure: THYROIDECTOMY CENTRAL COMPARTMENT AND LEFT LEVEL 4 NECK DISECTION;  Surgeon: Jodi Marble, MD;  Location: [place];   Service: ENT;  Laterality: N/A;   Social History   Socioeconomic History  . Marital status: Single    Spouse name: Not on file  . Number of children: 0  . Years of education: Not on file  . Highest education level: Not on file  Occupational History  .  College student  Tobacco Use  . Smoking status: Never Smoker  . Smokeless tobacco: Never Used  Substance and Sexual Activity  . Alcohol use: No  . Drug use: No   Allergies  Allergen Reactions  . Penicillins Hives    Has patient had a PCN reaction causing immediate rash, facial/tongue/throat swelling, SOB or lightheadedness with hypotension: Yes Has patient had a PCN reaction causing severe rash involving mucus membranes or skin necrosis: Yes Has patient had a PCN reaction that required hospitalization: Yes Has patient had a PCN reaction occurring within the last 10 years: No Childhood reaction If all of the above answers are "NO", then may proceed with Cephalosporin use.   Marland Kitchen Amoxicillin Hives  . Food Other (See Comments)    PEAS-RASH  . Pecan Nut (Diagnostic) Swelling and Rash    Throat swelling    Family History  Problem Relation Age of Onset  . Hypertension Father   . Diabetes Father   . Eczema Sister   . Diabetes Maternal Grandfather   . Hypertension Maternal Grandfather   .  Heart disease Maternal Grandfather   . Hypertension Paternal Grandmother   . Diabetes Paternal Grandfather    PE: There were no vitals taken for this visit. Wt Readings from Last 3 Encounters:  11/17/17 147 lb 12.8 oz (67 kg)  01/11/17 148 lb 5.9 oz (67.3 kg)  01/07/17 148 lb 4.8 oz (67.3 kg)   Constitutional:  in NAD  The physical exam was not performed (virtual visit).  ASSESSMENT: 1. Thyroid cancer - see HPI  2. Postsurgical Hypothyroidism  3.  Postsurgical hypocalcemia  4.  Vitamin D deficiency  PLAN:  1.  Metastatic thyroid cancer - papillary -Patient had clinical stage I thyroid cancer (due to age).  Pathologically,  this has been stage III TNM due to the size of the tumor, 5 cm, and metastases in neck lymph nodes.  RAI was indicated for postop thyroid remnant ablation and she had this in 02/2017.  We did discuss in the past that the main role for the RAI treatment is to facilitate monitoring in the long run, by checking thyroglobulin.  In her case, though, it also can help with lymph node metastasis.  A whole-body scan after RAI treatment was negative for distant metastasis. -At last visit, thyroglobulin was low, but still detectable, and ATA antibodies were negative -She is now a year and 5 months after her RAI treatment and she ideally would need to have another whole-body scan.  However, this was expensive for the patient so we will most likely proceed with a thyroid ultrasound after the corona virus pandemic winds down. -I will see her back in 4-6 months and will order the thyroid ultrasound then.  At that time, we will also repeat thyroglobulin and ATA.  2. Patient with history of thyroidectomy for thyroid cancer, now with iatrogenic hypothyroidism, on levothyroxine therapy.  At last visit, we decreased the levothyroxine dose to 112 mcg daily.  Subsequent TFTs were normal 04/2018. - pt feels good on this dose. - we discussed about taking the thyroid hormone every day, with water, >30 minutes before breakfast, separated by >4 hours from acid reflux medications, calcium, iron, multivitamins. Pt. Is not taking it correctly >> started prenatal vitamins (no desire for pregnancy) 1 mo ago and takes these 1h post LT4. Advised her to move them at night. - will check thyroid tests when she returns to clinic: TSH and fT4  3. Postsurgical hypocalcemia -She has slightly low calcium after the surgery, but this normalized 10/2017. - no perioral numbness/ tingling or acral cramping -We will need to recheck at next visit  4.  Vitamin D deficiency -At last visit, vitamin D was low, at 17.65 and we started her on 5000 units  vitamin D daily -Subsequent vitamin D level was normal in 04/2018 -We will repeat this at next visit   Philemon Kingdom, MD PhD [facility]

## 2018-08-29 NOTE — Patient Instructions (Signed)
  Please continue Levothyroxine 112 mcg daily.  Take the thyroid hormone every day, with water, at least 30 minutes before breakfast, separated by at least 4 hours from: - acid reflux medications - calcium - iron - multivitamins  Please move the multivitamins at night.  Also, continue vitamin D 5000 units daily.  Please come back for a follow-up appointment in 4-6 months.

## 2018-09-19 MED FILL — LEVOTHYROXINE 112 MCG TAB: 112 | 60 days supply | Qty: 60 | Fill #0

## 2018-11-22 MED FILL — LEVOTHYROXINE 112 MCG TAB: 112 | 90 days supply | Qty: 90 | Fill #0

## 2018-11-23 ENCOUNTER — Other Ambulatory Visit: Payer: Self-pay

## 2018-11-23 MED ORDER — LEVOTHYROXINE SODIUM 112 MCG PO TABS: ORAL_TABLET | ORAL | 3 refills | Status: DC

## 2018-12-04 ENCOUNTER — Telehealth: Payer: Self-pay | Admitting: Internal Medicine

## 2018-12-04 DIAGNOSIS — E890 Postprocedural hypothyroidism: Secondary | ICD-10-CM

## 2018-12-04 DIAGNOSIS — C73 Malignant neoplasm of thyroid gland: Secondary | ICD-10-CM

## 2018-12-04 NOTE — Telephone Encounter (Signed)
Let's have her back for a TSH and fT4 to see if the dose is correct.  If the tests are normal, it would be quite unlikely for the headaches to be from levothyroxine.

## 2018-12-04 NOTE — Telephone Encounter (Signed)
Patient called to advise that she has been having daily headaches. Wants to know if could be because of her Levothyroxine

## 2018-12-06 NOTE — Telephone Encounter (Signed)
Notified patient of message from Dr. Cruzita Lederer, patient expressed understanding and agreement. No further questions.  Labs ordered and patient scheduled for tomorrow lab.

## 2018-12-06 NOTE — Addendum Note (Signed)
Addended by: Cardell Peach I on: 12/06/2018 01:35 PM   Modules accepted: Orders

## 2018-12-07 ENCOUNTER — Other Ambulatory Visit: Payer: Self-pay

## 2018-12-07 ENCOUNTER — Other Ambulatory Visit (INDEPENDENT_AMBULATORY_CARE_PROVIDER_SITE_OTHER): Payer: BC Managed Care – PPO

## 2018-12-07 DIAGNOSIS — E890 Postprocedural hypothyroidism: Secondary | ICD-10-CM

## 2018-12-07 DIAGNOSIS — C73 Malignant neoplasm of thyroid gland: Secondary | ICD-10-CM

## 2018-12-08 LAB — TSH: TSH: 0.06 u[IU]/mL — ABNORMAL LOW (ref 0.35–4.50)

## 2018-12-08 LAB — T4, FREE: Free T4: 1.59 ng/dL (ref 0.60–1.60)

## 2018-12-12 ENCOUNTER — Telehealth: Payer: Self-pay

## 2018-12-12 DIAGNOSIS — C73 Malignant neoplasm of thyroid gland: Secondary | ICD-10-CM

## 2018-12-12 DIAGNOSIS — E890 Postprocedural hypothyroidism: Secondary | ICD-10-CM

## 2018-12-12 MED ORDER — LEVOTHYROXINE SODIUM 100 MCG PO TABS: 100.0000 ug | ORAL_TABLET | Freq: Every day | ORAL | 3 refills | Status: DC

## 2018-12-12 NOTE — Telephone Encounter (Signed)
Notified patient of message from Dr. Cruzita Lederer, patient expressed understanding and agreement. No further questions.  Old RX removed from list.  New RX sent and future labs ordered.

## 2018-12-12 NOTE — Telephone Encounter (Signed)
-----   Message from Philemon Kingdom, MD sent at 12/08/2018  4:40 PM EDT ----- Lenna Sciara, can you please call pt: Heather Glass dose of levothyroxine appears too high and this may be the reason for Heather Glass headaches.  We will need to decrease the dose to 100 mcg daily.  Can you please call in this dose to [facility] Glass pharmacy and take off the previous dose from Heather Glass medication list?  Also, she will need to come back for labs in 1.5 months to verify that his dose.  Can you please order a TSH and a free T4?

## 2018-12-14 MED FILL — LEVOTHYROXINE 100 MCG TAB: 100 | 90 days supply | Qty: 90 | Fill #0

## 2019-02-21 ENCOUNTER — Telehealth: Payer: Self-pay | Admitting: Internal Medicine

## 2019-02-21 NOTE — Telephone Encounter (Signed)
Patient states she is not doing well with levothyroxine (SYNTHROID) 100 MCG tablet. Patient states she feels more fatigued and shes gaining more weight frequently.  Please Advise, Thanks

## 2019-02-21 NOTE — Telephone Encounter (Signed)
Can we have her back for recheck of her TSH and free T4?  Her dose may not be adequate.  The side effects that she is reporting may be a sign of inappropriate replacement dose.  Labs are in.

## 2019-02-22 NOTE — Telephone Encounter (Signed)
Notified patient of message from Dr. Gherghe, patient expressed understanding and agreement. No further questions.  Lab appt made. 

## 2019-02-23 ENCOUNTER — Other Ambulatory Visit (INDEPENDENT_AMBULATORY_CARE_PROVIDER_SITE_OTHER): Payer: BC Managed Care – PPO

## 2019-02-23 ENCOUNTER — Other Ambulatory Visit: Payer: Self-pay

## 2019-02-23 DIAGNOSIS — E890 Postprocedural hypothyroidism: Secondary | ICD-10-CM | POA: Diagnosis not present

## 2019-02-23 DIAGNOSIS — C73 Malignant neoplasm of thyroid gland: Secondary | ICD-10-CM

## 2019-02-23 LAB — T4, FREE: Free T4: 1.45 ng/dL (ref 0.60–1.60)

## 2019-02-23 LAB — TSH: TSH: 0.47 u[IU]/mL (ref 0.35–4.50)

## 2019-02-26 ENCOUNTER — Telehealth: Payer: Self-pay

## 2019-02-26 NOTE — Telephone Encounter (Signed)
Tried calling patient 2X, no answer and VM not set up.

## 2019-02-26 NOTE — Telephone Encounter (Signed)
Notified patient of message from Dr. Gherghe, patient expressed understanding and agreement. No further questions.  

## 2019-02-26 NOTE — Telephone Encounter (Signed)
-----   Message from Philemon Kingdom, MD sent at 02/23/2019  5:01 PM EDT ----- Lenna Sciara, can you please call pt: Thyroid tests are now normal.  We can continue with the same dose of levothyroxine.

## 2019-03-13 MED FILL — LEVOTHYROXINE 100 MCG TAB: 100 | 90 days supply | Qty: 90 | Fill #1

## 2019-03-27 MED FILL — FLUCONAZOLE 150 MG TABLET: 150 | 2 days supply | Qty: 2 | Fill #0

## 2019-04-19 ENCOUNTER — Other Ambulatory Visit: Payer: Self-pay

## 2019-04-19 DIAGNOSIS — Z20822 Contact with and (suspected) exposure to covid-19: Secondary | ICD-10-CM

## 2019-04-21 LAB — NOVEL CORONAVIRUS, NAA: SARS-CoV-2, NAA: NOT DETECTED

## 2019-06-13 MED FILL — LEVOTHYROXINE 100 MCG TAB: 100 | 90 days supply | Qty: 90 | Fill #2

## 2019-09-17 MED FILL — FLUCONAZOLE 150 MG TABLET: 150 | 3 days supply | Qty: 2 | Fill #0

## 2019-09-18 MED FILL — LEVOTHYROXINE 100 MCG TAB: 100 | 90 days supply | Qty: 90 | Fill #3

## 2019-12-21 ENCOUNTER — Other Ambulatory Visit: Payer: Self-pay | Admitting: Internal Medicine

## 2019-12-24 ENCOUNTER — Telehealth: Payer: Self-pay | Admitting: Internal Medicine

## 2019-12-24 MED ORDER — LEVOTHYROXINE SODIUM 100 MCG PO TABS: 100.0000 ug | ORAL_TABLET | Freq: Every day | ORAL | 0 refills | Status: DC

## 2019-12-24 MED FILL — LEVOTHYROXINE 100 MCG TABLE: 100 | 90 days supply | Qty: 90 | Fill #0

## 2019-12-24 NOTE — Telephone Encounter (Signed)
Patient called checking on her request for refill on Levothyroxine.

## 2019-12-24 NOTE — Telephone Encounter (Signed)
Noted. Thank you.  90 day supply sent. Pharmacy will contact patient.

## 2019-12-24 NOTE — Addendum Note (Signed)
Addended by: Cardell Peach I on: 12/24/2019 11:04 AM   Modules accepted: Orders

## 2019-12-24 NOTE — Telephone Encounter (Signed)
Her refill was denied due to the fact she has not been seen since March 2020.

## 2019-12-24 NOTE — Telephone Encounter (Signed)
Patient scheduled for next available - which was October 22nd. Patient states this refill is urgent and asked if we could give her a call as soon as its been called in (I did inform patient that normally we do not call the patient when the Rx is called, normally the pharmacy will contact her once its been called in) Ph# [phone].

## 2020-03-21 ENCOUNTER — Ambulatory Visit (INDEPENDENT_AMBULATORY_CARE_PROVIDER_SITE_OTHER): Payer: BC Managed Care – PPO | Admitting: Internal Medicine

## 2020-03-21 ENCOUNTER — Encounter: Payer: Self-pay | Admitting: Internal Medicine

## 2020-03-21 ENCOUNTER — Other Ambulatory Visit: Payer: Self-pay

## 2020-03-21 VITALS — BP 116/82 | HR 76 | Ht 63.0 in | Wt 158.2 lb

## 2020-03-21 DIAGNOSIS — E890 Postprocedural hypothyroidism: Secondary | ICD-10-CM

## 2020-03-21 DIAGNOSIS — C73 Malignant neoplasm of thyroid gland: Secondary | ICD-10-CM | POA: Diagnosis not present

## 2020-03-21 DIAGNOSIS — E559 Vitamin D deficiency, unspecified: Secondary | ICD-10-CM

## 2020-03-21 LAB — CALCIUM: Calcium: 8.9 mg/dL (ref 8.4–10.5)

## 2020-03-21 LAB — T4, FREE: Free T4: 1.28 ng/dL (ref 0.60–1.60)

## 2020-03-21 LAB — TSH: TSH: 1.16 u[IU]/mL (ref 0.35–4.50)

## 2020-03-21 NOTE — Progress Notes (Addendum)
Patient ID: Heather Glass, female   DOB: 26-Aug-1995, 24 y.o.   MRN: [id number]   This visit occurred during the SARS-CoV-2 public health emergency.  Safety protocols were in place, including screening questions prior to the visit, additional usage of staff PPE, and extensive cleaning of exam room while observing appropriate contact time as indicated for disinfecting solutions.   HPI  Heather Glass is a pleasant 24 y.o.-year-old female, initially referred by her PCP, Dr. Via presenting for follow-up for thyroid cancer, postsurgical hypothyroidism, postsurgical hypocalcemia, vitamin D deficiency.  Last visit 1 year and 7 months ago (virtual).  Thyroid cancer in 2018.  Reviewed and addended her cancer history:  Pt's mom noticed a lump in pt's neck >> saw PCP >> Thyroid U/S: large nodules bilaterally R: 2 cm nodule >> FNA AUS/FLUS L: 5.7 cm nodule >> FNA: PTC She had thyroidectomy with central node dissection by Dr. Erik Obey in [id number]/3846. 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   1. Thyroid, lobectomy, Left - PAPILLARY THYROID CARCINOMA, 5 CM. - MARGINS NOT INVOLVED. - CHRONIC LYMPHOCYTIC THYROIDITIS. 2. 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 - NO INVOLVEMENT BY MALIGNANCY. 3. Lymph nodes, regional resection, Central compartment - METASTATIC THYROID CARCINOMA IN TWO OF TWO LYMPH NODES (2/2). - BENIGN THYMUS GLAND. - BENIGN PARATHYROID GLAND. 4. Lymph nodes, regional resection, Left neck L 4 - METASTATIC THYROID CARCINOMA IN FOUR OF FOURTEEN LYMPH NODES (4/14).  Specimen: Thyroid and central compartment and left neck lymph nodes. Procedure (including lymph node sampling if applicable): Thyroidectomy and lymph node sampling. Specimen Integrity (intact/fragmented): Intact. Tumor focality: Unifocal. Dominant tumor: Left lobe. Maximum tumor size (cm): 5 cm. Tumor  laterality: Left lobe. Histologic type (including subtype and/or unique features as applicable): Papillary thyroid carcinoma. Tumor capsule: N/A. Extrathyroidal extension: No. Capsular invasion with degree of invasion if present: N/A. Margins: Free of tumor. Lymphatic or vascular invasion: Present. Lymph nodes: # examined 16; # positive; 6. Extracapsular extension (if applicable): Focal extranodal extension. TNM code: pT3a, pN1b. Non-neoplastic thyroid: Chronic lymphocytic thyroiditis. Comments: The entire left lobe is involved by a 5 cm papillary thyroid carcinoma which does not involve the margins of the specimen.  She developed swelling in her neck.  A neck ultrasound (02/07/2017) showed 2 cysts in the thyroid bed, 1 0.9 cm and one 4.6 cm.  She feels that these have resolved since then.  She saw Dr. Buddy Duty before who ordered RAI treatment (03/16/2017) with 127.3 mCi I-131.    Posttreatment whole body scan (03/28/2017) expected uptake in the thyroid bed and no metastasis.  Reviewed her latest thyroglobulin and thyroglobulin antibodies. Lab Results  Component Value Date   THYROGLB 0.5 (L) 11/18/2017   THGAB <1 11/18/2017   Postsurgical hypocalcemia: Lab Results  Component Value Date   PTH 14 (L) 11/18/2017   PTH Comment 11/18/2017   PTH CANCELED 11/18/2017   CALCIUM 9.1 11/18/2017   CALCIUM 9.2 11/18/2017   CALCIUM 8.8 (L) 01/11/2017   CALCIUM 8.6 (L) 01/11/2017   CALCIUM 9.1 01/07/2017   CALCIUM 9.3 06/18/2013   Vitamin D deficiency: Lab Results  Component Value Date   VD25OH 37.77 05/30/2018   VD25OH 17.65 (L) 11/18/2017   She stopped vitamin D 5000 units daily - 2 mo ago. No mm or joint aches.  Postsurgical hypothyroidism:  Pt is on levothyroxine 100 mcg daily (decreased 11/2018), taken: - in am - fasting - at least  30 min from b'fast - no Ca, Fe, PPIs - + B12 vitamins + probiotic at night - + on Biotin - last dose 2 weeks ago  Reviewed her TFTs: Lab Results   Component Value Date   TSH 0.47 02/23/2019   TSH 0.06 (L) 12/07/2018   TSH 0.69 05/30/2018   TSH 0.22 (L) 11/18/2017   FREET4 1.45 02/23/2019   FREET4 1.59 12/07/2018   FREET4 1.50 05/30/2018   FREET4 1.70 (H) 11/18/2017    Pt denies: - feeling nodules in neck - hoarseness - dysphagia - choking - SOB with lying down  She had 1 episode of panic attack and palpitations before last visit.  An EKG checked by PCP was normal.  She has + FH of thyroid disorders in: MGF, PGF.  + FH of thyroid cancer in PGGF.No FH of thyroid cancer. No h/o radiation tx to head or neck other than RAI treatment. No herbal supplements. No recent steroids use.   On melatonin. Also on Nexplanon.  ROS: Constitutional: + weight gain/no weight loss, no fatigue, no subjective hyperthermia, no subjective hypothermia Eyes: no blurry vision, no xerophthalmia ENT: no sore throat, + see HPI Cardiovascular: no CP/no SOB/no palpitations/no leg swelling Respiratory: no cough/no SOB/no wheezing Gastrointestinal: no N/no V/no D/no C/no acid reflux Musculoskeletal: no muscle aches/no joint aches Skin: no rashes, no hair loss Neurological: no tremors/no numbness/no tingling/no dizziness, + HAs  I reviewed pt's medications, allergies, PMH, social hx, family hx, and changes were documented in the history of present illness. Otherwise, unchanged from my initial visit note.  Past Medical History:  Diagnosis Date  . Cancer ([place])   . Counseling on health promotion and disease prevention 10/02/2014  . Eczema   . Headache   . Papillary carcinoma of thyroid ([place])   . Strep pharyngitis   . Wrist fracture, right    Past Surgical History:  Procedure Laterality Date  . THYROIDECTOMY N/A 01/10/2017   Procedure: THYROIDECTOMY CENTRAL COMPARTMENT AND LEFT LEVEL 4 NECK DISECTION;  Surgeon: Jodi Marble, MD;  Location: [facility];  Service: ENT;  Laterality: N/A;   Social History   Socioeconomic History  . Marital status: Single     Spouse name: Not on file  . Number of children: 0  . Years of education: Not on file  . Highest education level: Not on file  Occupational History  .  College student  Tobacco Use  . Smoking status: Never Smoker  . Smokeless tobacco: Never Used  Substance and Sexual Activity  . Alcohol use: No  . Drug use: No   Allergies  Allergen Reactions  . Penicillins Hives    Has patient had a PCN reaction causing immediate rash, facial/tongue/throat swelling, SOB or lightheadedness with hypotension: Yes Has patient had a PCN reaction causing severe rash involving mucus membranes or skin necrosis: Yes Has patient had a PCN reaction that required hospitalization: Yes Has patient had a PCN reaction occurring within the last 10 years: No Childhood reaction If all of the above answers are "NO", then may proceed with Cephalosporin use.   Marland Kitchen Amoxicillin Hives  . Food Other (See Comments)    PEAS-RASH  . Pecan Nut (Diagnostic) Swelling and Rash    Throat swelling    Family History  Problem Relation Age of Onset  . Hypertension Father   . Diabetes Father   . Eczema Sister   . Diabetes Maternal Grandfather   . Hypertension Maternal Grandfather   . Heart disease Maternal Grandfather   .  Hypertension Paternal Grandmother   . Diabetes Paternal Grandfather    PE: BP 116/82 (BP Location: Left Arm, Patient Position: Sitting, Cuff Size: Normal)   Pulse 76   Ht _0  (1.6 m)   Wt 158 lb 3.2 oz (71.8 kg)   SpO2 99%   BMI 28.02 kg/m  Wt Readings from Last 3 Encounters:  03/21/20 158 lb 3.2 oz (71.8 kg)  11/17/17 147 lb 12.8 oz (67 kg)  01/11/17 148 lb 5.9 oz (67.3 kg)   Constitutional: normal weight, in NAD Eyes: PERRLA, EOMI, no exophthalmos ENT: moist mucous membranes, no thyromegaly, no cervical lymphadenopathy Cardiovascular: RRR, No MRG Respiratory: CTA B Gastrointestinal: abdomen soft, NT, ND, BS+ Musculoskeletal: no deformities, strength intact in all 4 Skin: moist, warm, no  rashes Neurological: no tremor with outstretched hands, DTR normal in all 4  ASSESSMENT: 1. Thyroid cancer - see HPI  2. Postsurgical Hypothyroidism  3.  Postsurgical hypocalcemia  4.  Vitamin D deficiency  PLAN:  1.  Metastatic thyroid cancer - papillary -Patient has clinical stage I thyroid cancer due to age.  Pathologically, this has been stage III TNM due to the size of the tumor, 5 cm, and metastases in neck lymph nodes.  RAI was indicated for postop thyroid remnant ablation and she had this in 02/2017.  We did discuss in the past that the main role for the RAI treatment is to facilitate monitoring in the long run, by checking thyroglobulin.  In her case, though, it also can help with lymph node metastasis.  A whole-body scan after RAI treatment was negative for distant metastasis -At last check thyroglobulin was low but still detectable and ATA antibodies were negative. -We will recheck her thyroglobulin and ATA antibodies today -We could not check imaging tests in the last 2 years due to the coronavirus pandemic.  Ideally, she would need a whole-body scan but this was expensive for her in the past.  Therefore, we will check a neck ultrasound. -I will see her back in a year  2. Patient with history of thyroidectomy for thyroid cancer, now with iatrogenic hypothyroidism, on levothyroxine therapy. - latest thyroid labs reviewed with pt >> normal: Lab Results  Component Value Date   TSH 0.47 02/23/2019   - she continues on LT4 100 mcg daily - pt feels good on this dose. She can get emotional (improved) and more susceptible to weight gain (10 lb since last OV), also some HA - we discussed about taking the thyroid hormone every day, with water, >30 minutes before breakfast, separated by >4 hours from acid reflux medications, calcium, iron, multivitamins. Pt. is taking it correctly now -at last visit, she was taking prenatal vitamins in the morning, only 1 hour after LT4,  and we move  them at night.  At this visit she tells me that she is not taking a multivitamin but a B12 vitamin + probiotic. - will check thyroid tests today: TSH and fT4 - If labs are abnormal, she will need to return for repeat TFTs in 1.5 months  3. Postsurgical hypocalcemia -She had slightly low calcium after surgery, but this normalized in 10/2017 -She denies  perioral numbness/tingling or acral cramping -We will recheck her calcium level today  4.  Vitamin D deficiency -At last visit, vitamin D was low, at 17.65 and we started her on 5000 units vitamin D daily >> stopped 2 mo ago (unclear why?) -Vitamin D level was normal in 04/2018 but she did not have a repeat  afterwards -We will recheck her vitamin D level today  Needs refills.  Component     Latest Ref Rng & Units 03/21/2020          Thyroglobulin     ng/mL 0.5 (L)  Comment        Calcium     8.4 - 10.5 mg/dL 8.9  TSH     0.35 - 4.50 uIU/mL 1.16  T4,Free(Direct)     0.60 - 1.60 ng/dL 1.28  Thyroglobulin Ab     < or = 1 IU/mL <1  Vitamin D, 25-Hydroxy     30.0 - 100.0 ng/mL 21.8 (L)  Vitamin D level is still low.  We will restart the 5000 units vitamin D daily and recheck her vitamin D level in 2 months. TSH is higher than target in her particular case >> we will increase the dose to 112 mcg daily and recheck the test in 2 months. Thyroglobulin is not undetectable.  We will go ahead and check a neck ultrasound.  Neck ultrasound (04/01/2020): Isthmus: Surgically absent. There is no residual nodular soft tissue within the isthmic resection bed.  Right lobe: Surgically absent. There is no residual nodular soft tissue within the right lobectomy resection bed.  Left lobe: Surgically absent. There is no residual nodular soft tissue within the left lobectomy resection bed.  _________________________________________________________  Previously identified postop thyroid bed fluid collections have resolved in the  interval.  No regional cervical lymphadenopathy.  IMPRESSION: Post total thyroidectomy without evidence of residual or locally recurrent disease.  Philemon Kingdom, MD PhD [facility] Endocrinology

## 2020-03-21 NOTE — Patient Instructions (Addendum)
Please stop at the lab. ° °Please continue Levothyroxine 100 mcg daily. ° °Take the thyroid hormone every day, with water, at least 30 minutes before breakfast, separated by at least 4 hours from: °- acid reflux medications °- calcium °- iron °- multivitamins ° °Please come back for a follow-up appointment in 1 year. ° ° ° °

## 2020-03-22 LAB — VITAMIN D 25 HYDROXY (VIT D DEFICIENCY, FRACTURES): Vit D, 25-Hydroxy: 21.8 ng/mL — ABNORMAL LOW (ref 30.0–100.0)

## 2020-03-24 ENCOUNTER — Other Ambulatory Visit: Payer: Self-pay | Admitting: Internal Medicine

## 2020-03-24 LAB — THYROGLOBULIN ANTIBODY: Thyroglobulin Ab: <1 IU/mL (ref <=1)

## 2020-03-24 LAB — THYROGLOBULIN LEVEL: Thyroglobulin: 0.5 ng/mL — ABNORMAL LOW

## 2020-03-25 ENCOUNTER — Other Ambulatory Visit: Payer: Self-pay | Admitting: Internal Medicine

## 2020-03-25 MED ORDER — LEVOTHYROXINE SODIUM 112 MCG PO TABS: 112.0000 ug | ORAL_TABLET | Freq: Every day | ORAL | 3 refills | Status: DC

## 2020-03-25 MED FILL — LEVOTHYROXINE SODIUM 112 MC: 112 | 60 days supply | Qty: 60 | Fill #0

## 2020-04-01 ENCOUNTER — Ambulatory Visit
Admission: RE | Admit: 2020-04-01 | Discharge: 2020-04-01 | Disposition: A | Discharge status: Home / Self Care | Payer: BC Managed Care – PPO | Source: Ambulatory Visit | Attending: Internal Medicine | Admitting: Internal Medicine

## 2020-05-26 MED FILL — LEVOTHYROXINE 100 MCG TABLE: 100 | 90 days supply | Qty: 90 | Fill #0

## 2020-05-27 ENCOUNTER — Other Ambulatory Visit: Payer: BC Managed Care – PPO

## 2020-06-02 ENCOUNTER — Other Ambulatory Visit: Payer: Self-pay

## 2020-06-02 ENCOUNTER — Other Ambulatory Visit (INDEPENDENT_AMBULATORY_CARE_PROVIDER_SITE_OTHER): Payer: BC Managed Care – PPO

## 2020-06-02 DIAGNOSIS — E890 Postprocedural hypothyroidism: Secondary | ICD-10-CM | POA: Diagnosis not present

## 2020-06-02 DIAGNOSIS — E559 Vitamin D deficiency, unspecified: Secondary | ICD-10-CM

## 2020-06-02 LAB — TSH: TSH: 0.08 u[IU]/mL — ABNORMAL LOW (ref 0.35–4.50)

## 2020-06-02 LAB — T4, FREE: Free T4: 1.45 ng/dL (ref 0.60–1.60)

## 2020-06-03 ENCOUNTER — Other Ambulatory Visit: Payer: Self-pay | Admitting: Internal Medicine

## 2020-06-03 DIAGNOSIS — E559 Vitamin D deficiency, unspecified: Secondary | ICD-10-CM

## 2020-06-03 DIAGNOSIS — E890 Postprocedural hypothyroidism: Secondary | ICD-10-CM

## 2020-06-03 LAB — VITAMIN D 25 HYDROXY (VIT D DEFICIENCY, FRACTURES): Vit D, 25-Hydroxy: 29.9 ng/mL — ABNORMAL LOW (ref 30.0–100.0)

## 2020-06-03 MED ORDER — LEVOTHYROXINE SODIUM 100 MCG PO TABS: 100.0000 ug | ORAL_TABLET | ORAL | 3 refills | Status: DC

## 2020-06-03 MED ORDER — LEVOTHYROXINE SODIUM 112 MCG PO TABS: 112.0000 ug | ORAL_TABLET | ORAL | 3 refills | Status: DC

## 2020-06-10 MED FILL — LEVOTHYROXINE SODIUM 112 MC: 112 | 60 days supply | Qty: 60 | Fill #1

## 2020-07-09 ENCOUNTER — Telehealth: Payer: Self-pay | Admitting: Internal Medicine

## 2020-07-09 NOTE — Telephone Encounter (Signed)
Pt called with concerns wondering if she is having side effects due to her Levothyroxine? Pt is having chest tightness/pressure, rapid heart palpitations and while she has these, shortness of breath comes and goes. Pt is also getting lightheaded around evening time along with headaches.  Ph# [phone]

## 2020-07-10 NOTE — Telephone Encounter (Signed)
She is currently taking 100 mcg and 112 mcg alternating days. She has confirmed she is still taking her medication as prescribed.

## 2020-07-10 NOTE — Telephone Encounter (Signed)
Please have her take it every other day.  If we stop it completely, this may influence the results.

## 2020-07-10 NOTE — Telephone Encounter (Signed)
T, These can be caused by taking too much levothyroxine, but not side effects of levothyroxine per se.  Let's have her back for a TSH and a free T4  - can you please order these? Ty, C

## 2020-07-10 NOTE — Telephone Encounter (Signed)
Pt scheduled for 2/15. Should she continue current regimen or cease taking Levothyroxine until after blood work.

## 2020-07-10 NOTE — Telephone Encounter (Signed)
Please advise 

## 2020-07-11 NOTE — Telephone Encounter (Signed)
T, Ok to take just 100 every other day until the labs. Ty, C

## 2020-07-11 NOTE — Telephone Encounter (Signed)
Called and advised pt to take 100 mcg every other day until she gets her lab work done. Pt verbalized understanding.

## 2020-07-14 ENCOUNTER — Other Ambulatory Visit: Payer: BC Managed Care – PPO

## 2020-07-15 ENCOUNTER — Other Ambulatory Visit: Payer: BC Managed Care – PPO

## 2020-07-16 ENCOUNTER — Other Ambulatory Visit: Payer: Self-pay

## 2020-07-16 ENCOUNTER — Other Ambulatory Visit (INDEPENDENT_AMBULATORY_CARE_PROVIDER_SITE_OTHER): Payer: BC Managed Care – PPO

## 2020-07-16 DIAGNOSIS — E559 Vitamin D deficiency, unspecified: Secondary | ICD-10-CM

## 2020-07-16 DIAGNOSIS — E890 Postprocedural hypothyroidism: Secondary | ICD-10-CM

## 2020-07-16 LAB — TSH: TSH: 0.09 u[IU]/mL — ABNORMAL LOW (ref 0.35–4.50)

## 2020-07-16 LAB — T4, FREE: Free T4: 1.58 ng/dL (ref 0.60–1.60)

## 2020-07-17 ENCOUNTER — Other Ambulatory Visit: Payer: Self-pay | Admitting: Internal Medicine

## 2020-07-17 DIAGNOSIS — E890 Postprocedural hypothyroidism: Secondary | ICD-10-CM

## 2020-07-17 LAB — VITAMIN D 25 HYDROXY (VIT D DEFICIENCY, FRACTURES): Vit D, 25-Hydroxy: 43.3 ng/mL (ref 30.0–100.0)

## 2020-07-17 MED ORDER — LEVOTHYROXINE SODIUM 88 MCG PO TABS: 88.0000 ug | ORAL_TABLET | ORAL | 3 refills | Status: DC

## 2020-07-17 MED FILL — LEVOTHYROXINE 88 MCG TABLET: 88 | 60 days supply | Qty: 30 | Fill #0

## 2020-08-21 ENCOUNTER — Other Ambulatory Visit: Payer: BC Managed Care – PPO

## 2020-08-25 ENCOUNTER — Other Ambulatory Visit: Payer: BC Managed Care – PPO

## 2020-08-27 ENCOUNTER — Other Ambulatory Visit: Payer: Self-pay

## 2020-08-27 ENCOUNTER — Other Ambulatory Visit (INDEPENDENT_AMBULATORY_CARE_PROVIDER_SITE_OTHER): Payer: BC Managed Care – PPO

## 2020-08-27 DIAGNOSIS — E890 Postprocedural hypothyroidism: Secondary | ICD-10-CM

## 2020-08-27 LAB — T4, FREE: Free T4: 1.35 ng/dL (ref 0.60–1.60)

## 2020-08-27 LAB — TSH: TSH: 0.8 u[IU]/mL (ref 0.35–4.50)

## 2020-08-30 ENCOUNTER — Other Ambulatory Visit (HOSPITAL_COMMUNITY): Payer: Self-pay

## 2020-09-09 MED FILL — Levothyroxine Sodium Tab 88 MCG: ORAL | 60 days supply | Qty: 30 | Fill #0 | Status: CN

## 2020-09-10 ENCOUNTER — Other Ambulatory Visit (HOSPITAL_COMMUNITY): Payer: Self-pay

## 2020-09-18 ENCOUNTER — Other Ambulatory Visit (HOSPITAL_COMMUNITY): Payer: Self-pay

## 2020-10-07 ENCOUNTER — Other Ambulatory Visit (HOSPITAL_COMMUNITY): Payer: Self-pay

## 2020-10-07 ENCOUNTER — Other Ambulatory Visit: Payer: Self-pay | Admitting: Internal Medicine

## 2020-10-07 MED ORDER — LEVOTHYROXINE SODIUM 100 MCG PO TABS
100.0000 ug | ORAL_TABLET | ORAL | 3 refills | Status: DC
  Filled 2020-10-07: qty 45, 90d supply, fill #0
  Filled 2020-12-22: qty 45, 90d supply, fill #1
  Filled 2021-03-24: qty 45, 90d supply, fill #2

## 2020-10-07 MED FILL — Levothyroxine Sodium Tab 88 MCG: ORAL | 60 days supply | Qty: 30 | Fill #0 | Status: AC

## 2020-12-08 MED FILL — Levothyroxine Sodium Tab 88 MCG: ORAL | 60 days supply | Qty: 30 | Fill #1 | Status: CN

## 2020-12-09 ENCOUNTER — Other Ambulatory Visit (HOSPITAL_COMMUNITY): Payer: Self-pay

## 2020-12-17 ENCOUNTER — Other Ambulatory Visit (HOSPITAL_COMMUNITY): Payer: Self-pay

## 2020-12-22 ENCOUNTER — Other Ambulatory Visit (HOSPITAL_COMMUNITY): Payer: Self-pay

## 2020-12-22 MED FILL — Levothyroxine Sodium Tab 88 MCG: ORAL | 60 days supply | Qty: 30 | Fill #1 | Status: AC

## 2020-12-24 ENCOUNTER — Other Ambulatory Visit (HOSPITAL_COMMUNITY): Payer: Self-pay

## 2021-02-11 ENCOUNTER — Other Ambulatory Visit (HOSPITAL_COMMUNITY): Payer: Self-pay

## 2021-02-11 MED FILL — Levothyroxine Sodium Tab 88 MCG: ORAL | 60 days supply | Qty: 30 | Fill #2 | Status: CN

## 2021-02-19 ENCOUNTER — Other Ambulatory Visit (HOSPITAL_COMMUNITY): Payer: Self-pay

## 2021-02-26 ENCOUNTER — Other Ambulatory Visit (HOSPITAL_COMMUNITY): Payer: Self-pay

## 2021-02-26 MED FILL — Levothyroxine Sodium Tab 88 MCG: ORAL | 60 days supply | Qty: 30 | Fill #2 | Status: AC

## 2021-03-25 ENCOUNTER — Other Ambulatory Visit (HOSPITAL_COMMUNITY): Payer: Self-pay

## 2021-03-27 ENCOUNTER — Ambulatory Visit: Payer: BC Managed Care – PPO | Admitting: Internal Medicine

## 2021-04-29 ENCOUNTER — Other Ambulatory Visit: Payer: Self-pay | Admitting: Internal Medicine

## 2021-04-30 ENCOUNTER — Telehealth: Payer: Self-pay

## 2021-04-30 ENCOUNTER — Other Ambulatory Visit (HOSPITAL_COMMUNITY): Payer: Self-pay

## 2021-04-30 DIAGNOSIS — E890 Postprocedural hypothyroidism: Secondary | ICD-10-CM

## 2021-04-30 MED ORDER — LEVOTHYROXINE SODIUM 88 MCG PO TABS
88.0000 ug | ORAL_TABLET | ORAL | 0 refills | Status: DC
  Filled 2021-04-30: qty 15, 30d supply, fill #0

## 2021-04-30 NOTE — Telephone Encounter (Signed)
MRN:  [id number] Heather Glass is out of Levothyroxine. Need refill sent to [facility].  Her next appointment is on 05/07/2021.   Please call pt on [phone] when or if  Rx is transmitted.

## 2021-04-30 NOTE — Telephone Encounter (Signed)
Rx sent to preferred pharmacy.

## 2021-05-07 ENCOUNTER — Encounter: Payer: Self-pay | Admitting: Internal Medicine

## 2021-05-07 ENCOUNTER — Other Ambulatory Visit: Payer: Self-pay

## 2021-05-07 ENCOUNTER — Ambulatory Visit (INDEPENDENT_AMBULATORY_CARE_PROVIDER_SITE_OTHER): Payer: BC Managed Care – PPO | Admitting: Internal Medicine

## 2021-05-07 VITALS — BP 110/72 | HR 74 | Ht 63.0 in | Wt 170.4 lb

## 2021-05-07 DIAGNOSIS — C73 Malignant neoplasm of thyroid gland: Secondary | ICD-10-CM | POA: Diagnosis not present

## 2021-05-07 DIAGNOSIS — E559 Vitamin D deficiency, unspecified: Secondary | ICD-10-CM

## 2021-05-07 DIAGNOSIS — E890 Postprocedural hypothyroidism: Secondary | ICD-10-CM

## 2021-05-07 NOTE — Patient Instructions (Signed)
Please stop at the lab.  Please continue Levothyroxine 88 alternating with 100 mcg every other day.  Take the thyroid hormone every day, with water, at least 30 minutes before breakfast, separated by at least 4 hours from: - acid reflux medications - calcium - iron - multivitamins  Please come back for a follow-up appointment in 1 year.

## 2021-05-07 NOTE — Progress Notes (Signed)
Patient ID: Heather Glass, female   DOB: 11/18/1995, 25 y.o.   MRN: [id number]   This visit occurred during the SARS-CoV-2 public health emergency.  Safety protocols were in place, including screening questions prior to the visit, additional usage of staff PPE, and extensive cleaning of exam room while observing appropriate contact time as indicated for disinfecting solutions.   HPI  Heather Glass is a pleasant 25 y.o.-year-old female, initially referred by her PCP, Dr. Via presenting for follow-up for thyroid cancer, postsurgical hypothyroidism, postsurgical hypocalcemia, vitamin D deficiency.  Last visit 1 year ago.  Interim history: She denies tremors, palpitations, anxiety, insomnia.  Occasional HAs and also being emotional.  Thyroid cancer  - dx'ed in 2018.  Reviewed and addended her cancer history:  Pt's mom noticed a lump in pt's neck >> saw PCP >> Thyroid U/S: large nodules bilaterally R: 2 cm nodule >> FNA AUS/FLUS L: 5.7 cm nodule >> FNA: PTC She had thyroidectomy with central node dissection by Dr. Wolicki in 12/2016. 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   1. Thyroid, lobectomy, Left - PAPILLARY THYROID CARCINOMA, 5 CM. - MARGINS NOT INVOLVED. - CHRONIC LYMPHOCYTIC THYROIDITIS. 2. 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 - NO INVOLVEMENT BY MALIGNANCY. 3. Lymph nodes, regional resection, Central compartment - METASTATIC THYROID CARCINOMA IN TWO OF TWO LYMPH NODES (2/2). - BENIGN THYMUS GLAND. - BENIGN PARATHYROID GLAND. 4. Lymph nodes, regional resection, Left neck L 4 - METASTATIC THYROID CARCINOMA IN FOUR OF FOURTEEN LYMPH NODES (4/14).  Specimen: Thyroid and central compartment and left neck lymph nodes. Procedure (including lymph node sampling if applicable): Thyroidectomy and lymph node sampling. Specimen Integrity (intact/fragmented):  Intact. Tumor focality: Unifocal. Dominant tumor: Left lobe. Maximum tumor size (cm): 5 cm. Tumor laterality: Left lobe. Histologic type (including subtype and/or unique features as applicable): Papillary thyroid carcinoma. Tumor capsule: N/A. Extrathyroidal extension: No. Capsular invasion with degree of invasion if present: N/A. Margins: Free of tumor. Lymphatic or vascular invasion: Present. Lymph nodes: # examined 16; # positive; 6. Extracapsular extension (if applicable): Focal extranodal extension. TNM code: pT3a, pN1b. Non-neoplastic thyroid: Chronic lymphocytic thyroiditis. Comments: The entire left lobe is involved by a 5 cm papillary thyroid carcinoma which does not involve the margins of the specimen.  She developed swelling in her neck.  A neck ultrasound (02/07/2017) showed 2 cysts in the thyroid bed, 1 0.9 cm and one 4.6 cm.  She feels that these have resolved since then.  She saw Dr. Kerr before who ordered RAI treatment (03/16/2017) with 127.3 mCi I-131.    Posttreatment whole body scan (03/28/2017) expected uptake in the thyroid bed and no metastasis.  Neck ultrasound (04/01/2020): Isthmus: Surgically absent. There is no residual nodular soft tissue within the isthmic resection bed.   Right lobe: Surgically absent. There is no residual nodular soft tissue within the right lobectomy resection bed.   Left lobe: Surgically absent. There is no residual nodular soft tissue within the left lobectomy resection bed. ________________________________________________________   Previously identified postop thyroid bed fluid collections have resolved in the interval.   No regional cervical lymphadenopathy.   IMPRESSION: Post total thyroidectomy without evidence of residual or locally recurrent disease.  Reviewed her latest thyroglobulin and thyroglobulin antibodies. Lab Results  Component Value Date   THYROGLB 0.5 (L) 03/21/2020   THYROGLB 0.5 (L) 11/18/2017   THGAB  <1 03/21/2020   THGAB <1 11/18/2017     Postsurgical hypocalcemia: Lab Results  Component Value Date   PTH 14 (L) 11/18/2017   PTH Comment 11/18/2017   PTH CANCELED 11/18/2017   CALCIUM 8.9 03/21/2020   CALCIUM 9.1 11/18/2017   CALCIUM 9.2 11/18/2017   CALCIUM 8.8 (L) 01/11/2017   CALCIUM 8.6 (L) 01/11/2017   CALCIUM 9.1 01/07/2017   CALCIUM 9.3 06/18/2013   Vitamin D deficiency: Lab Results  Component Value Date   VD25OH 43.3 07/16/2020   VD25OH 29.9 (L) 06/02/2020   VD25OH 21.8 (L) 03/21/2020   VD25OH 37.77 05/30/2018   VD25OH 17.65 (L) 11/18/2017   She stopped vitamin D 5000 units daily 2 months prior to our last visit.  I advised her to restart it - now taking it.   Postsurgical hypothyroidism:  Pt is on levothyroxine 88 alternating with 100 mcg every other day (last dose decrease 07/2020): - in am - fasting - at least 30 min from b'fast - no Ca, Fe, PPIs - + on Biotin occasionally - last 1-2 mo ago.  Reviewed her TFTs: Lab Results  Component Value Date   TSH 0.80 08/27/2020   TSH 0.09 (L) 07/16/2020   TSH 0.08 (L) 06/02/2020   TSH 1.16 03/21/2020   TSH 0.47 02/23/2019   TSH 0.06 (L) 12/07/2018   TSH 0.69 05/30/2018   TSH 0.22 (L) 11/18/2017   FREET4 1.35 08/27/2020   FREET4 1.58 07/16/2020   FREET4 1.45 06/02/2020   FREET4 1.28 03/21/2020   FREET4 1.45 02/23/2019   FREET4 1.59 12/07/2018   FREET4 1.50 05/30/2018   FREET4 1.70 (H) 11/18/2017    Pt denies: - feeling nodules in neck - hoarseness - dysphagia - choking - SOB with lying down  She had 1 episode of panic attack and palpitations before last visit.  An EKG checked by PCP was normal.  She has + FH of thyroid disorders in: MGF, PGF.  + FH of thyroid cancer in PGGF.No FH of thyroid cancer. No h/o radiation tx to head or neck other than RAI treatment. No herbal supplements. No recent steroids use.   On melatonin. Also on Nexplanon.  ROS: + see HPI  I reviewed pt's medications, allergies,  PMH, social hx, family hx, and changes were documented in the history of present illness. Otherwise, unchanged from my initial visit note.  Past Medical History:  Diagnosis Date   Cancer (HCC)    Counseling on health promotion and disease prevention 10/02/2014   Eczema    Headache    Papillary carcinoma of thyroid (HCC)    Strep pharyngitis    Wrist fracture, right    Past Surgical History:  Procedure Laterality Date   THYROIDECTOMY N/A 01/10/2017   Procedure: THYROIDECTOMY CENTRAL COMPARTMENT AND LEFT LEVEL 4 NECK DISECTION;  Surgeon: Wolicki, Karol, MD;  Location: [facility] OR;  Service: ENT;  Laterality: N/A;   Social History   Socioeconomic History   Marital status: Single    Spouse name: Not on file   Number of children: 0   Years of education: Not on file   Highest education level: Not on file  Occupational History    College student  Tobacco Use   Smoking status: Never Smoker   Smokeless tobacco: Never Used  Substance and Sexual Activity   Alcohol use: No   Drug use: No   Allergies  Allergen Reactions   Penicillins Hives    Has patient had a PCN reaction causing immediate rash, facial/tongue/throat swelling, SOB or lightheadedness with hypotension: Yes Has patient had a   PCN reaction causing severe rash involving mucus membranes or skin necrosis: Yes Has patient had a PCN reaction that required hospitalization: Yes Has patient had a PCN reaction occurring within the last 10 years: No Childhood reaction If all of the above answers are "NO", then may proceed with Cephalosporin use.    Amoxicillin Hives   Food Other (See Comments)    PEAS-RASH   Pecan Nut (Diagnostic) Swelling and Rash    Throat swelling    Family History  Problem Relation Age of Onset   Hypertension Father    Diabetes Father    Eczema Sister    Diabetes Maternal Grandfather    Hypertension Maternal Grandfather    Heart disease Maternal Grandfather    Hypertension Paternal Grandmother    Diabetes  Paternal Grandfather    PE: There were no vitals taken for this visit. Wt Readings from Last 3 Encounters:  03/21/20 158 lb 3.2 oz (71.8 kg)  11/17/17 147 lb 12.8 oz (67 kg)  01/11/17 148 lb 5.9 oz (67.3 kg)   Constitutional: normal weight, in NAD Eyes: PERRLA, EOMI, no exophthalmos ENT: moist mucous membranes, no thyromegaly, no cervical lymphadenopathy Cardiovascular: RRR, No MRG Respiratory: CTA B Musculoskeletal: no deformities, strength intact in all 4 Skin: moist, warm, no rashes Neurological: no tremor with outstretched hands, DTR normal in all 4  ASSESSMENT: 1. Thyroid cancer - see HPI  2. Postsurgical Hypothyroidism  3.  Postsurgical hypocalcemia  4.  Vitamin D deficiency  PLAN:  1.  Metastatic thyroid cancer - papillary -Patient has clinical stage I thyroid cancer due to age at diagnosis.  Pathologically, this has been stage III TNM due to the size of the tumor, 5 cm, and metastases in neck lymph nodes.  RAI was indicated for postop thyroid remnant ablation and she had this in 02/2017.  We did discuss in the past that the main role for the RAI treatment is to facilitate monitoring in the long run, by checking thyroglobulin.  In her case, though, it can also help with lymph node metastasis.  A whole-body scan after RAI treatment was negative for distant metastasis. -At last visit, thyroglobulin was still detectable while ATA antibodies were undetectable -We could not check imaging tests in the last 2 years due to the coronavirus pandemic.  Ideally, she would need a whole-body scan but this was expensive for her in the past.  Therefore, after last visit, we checked a neck ultrasound.  This did not show any suspicious masses in her neck. -I will see her back in 1 year  2. Patient with history of thyroidectomy for thyroid cancer, now with iatrogenic hypothyroidism, on levothyroxine therapy. - latest thyroid labs reviewed with pt. >> TSH at goal after we increased the dose of  levothyroxine: Lab Results  Component Value Date   TSH 0.80 08/27/2020  - she continues on LT4 88 alternating with 100 mcg every other day (dose decreased 07/2020) - pt feels good on this dose. - we discussed about taking the thyroid hormone every day, with water, >30 minutes before breakfast, separated by >4 hours from acid reflux medications, calcium, iron, multivitamins. Pt. is taking it correctly. - will check thyroid tests today: TSH and fT4 - If labs are abnormal, she will need to return for repeat TFTs in 1.5 months -At this visit, she inquires about management of levothyroxine during pregnancy.  She does not have immediate plans for pregnancy.  We discussed about the need to increase the dose during pregnancy and change  in the testing interval.  3. Postsurgical hypocalcemia -She has slightly low calcium after surgery but this normalized in 10/2017 and remained normal afterwards: Lab Results  Component Value Date   CALCIUM 8.9 03/21/2020  -No perioral numbness or tingling or acral cramping -We will not recheck her calcium level today  4.  Vitamin D deficiency -At last visit, vitamin D was low, at 17.65 and we started her on 5000 units vitamin D daily >> at last visit, she was off the medication for 2 months... -At last visit, vitamin D level was low so we restarted her previous supplement -Latest vitamin D level available for review was normal in 07/2020. -We will recheck her vitamin D level today  Component     Latest Ref Rng & Units 05/07/2021  Thyroglobulin     ng/mL 1.0 (L)  Comment        TSH     0.35 - 5.50 uIU/mL 7.11 (H)  T4,Free(Direct)     0.60 - 1.60 ng/dL 1.17  VITD     30.00 - 100.00 ng/mL 21.43 (L)  Thyroglobulin Ab     < or = 1 IU/mL <1  Vitamin D level is very low.  I suspect that she is missing vitamin D doses.  I will advise her to start taking the 5000 units daily consistently and repeat in 2 months. TSH is also high.  Will increase the dose of  levothyroxine to 100  mcg daily and repeat the tests in 1.5-2 months.  The thyroglobulin is higher than before, possibly 2/2 elevated TSH.  I would like to repeat this at next lab draw.  Philemon Kingdom, MD PhD [facility] Endocrinology

## 2021-05-08 LAB — VITAMIN D 25 HYDROXY (VIT D DEFICIENCY, FRACTURES): VITD: 21.43 ng/mL — ABNORMAL LOW (ref 30.00–100.00)

## 2021-05-08 LAB — T4, FREE: Free T4: 1.17 ng/dL (ref 0.60–1.60)

## 2021-05-08 LAB — THYROGLOBULIN LEVEL: Thyroglobulin: 1 ng/mL — ABNORMAL LOW

## 2021-05-08 LAB — TSH: TSH: 7.11 u[IU]/mL — ABNORMAL HIGH (ref 0.35–5.50)

## 2021-05-08 LAB — THYROGLOBULIN ANTIBODY: Thyroglobulin Ab: <1 IU/mL (ref <=1)

## 2021-05-08 MED ORDER — LEVOTHYROXINE SODIUM 100 MCG PO TABS
100.0000 ug | ORAL_TABLET | Freq: Every day | ORAL | 3 refills | Status: DC
Start: 2021-05-08 — End: 2021-05-29

## 2021-05-12 NOTE — Telephone Encounter (Signed)
Pt contacted and appt scheduled

## 2021-05-29 ENCOUNTER — Other Ambulatory Visit (HOSPITAL_COMMUNITY): Payer: Self-pay

## 2021-05-29 ENCOUNTER — Other Ambulatory Visit: Payer: Self-pay | Admitting: Internal Medicine

## 2021-05-29 DIAGNOSIS — E890 Postprocedural hypothyroidism: Secondary | ICD-10-CM

## 2021-05-29 MED ORDER — LEVOTHYROXINE SODIUM 100 MCG PO TABS
100.0000 ug | ORAL_TABLET | ORAL | 3 refills | Status: DC
  Filled 2021-05-29: qty 45, 90d supply, fill #0

## 2021-05-29 MED ORDER — LEVOTHYROXINE SODIUM 100 MCG PO TABS
100.0000 ug | ORAL_TABLET | Freq: Every day | ORAL | 0 refills | Status: DC
  Filled 2021-05-29: qty 90, 90d supply, fill #0

## 2021-05-29 NOTE — Telephone Encounter (Signed)
MEDICATION: levothyroxine (SYNTHROID) 100 MCG tablet  PHARMACY:   [facility] Phone:  [phone]  Fax:  [phone]      HAS THE PATIENT CONTACTED THEIR PHARMACY?  YES  IS THIS A 90 DAY SUPPLY : YES  IS PATIENT OUT OF MEDICATION: 1 PILL  IF NOT; HOW MUCH IS LEFT:   LAST APPOINTMENT DATE: @11 /30/2022  NEXT APPOINTMENT DATE:@12 /12/2020  DO WE HAVE YOUR PERMISSION TO LEAVE A DETAILED MESSAGE?:  OTHER COMMENTS: Pharmacy states this medication has been discontinued. Please provide alternate and let patient know.   **Let patient know to contact pharmacy at the end of the day to make sure medication is ready. **  ** Please notify patient to allow 48-72 hours to process**  **Encourage patient to contact the pharmacy for refills or they can request refills through [facility]**

## 2021-05-29 NOTE — Addendum Note (Signed)
Addended by: Lauralyn Primes on: 05/29/2021 10:56 AM   Modules accepted: Orders

## 2021-06-02 ENCOUNTER — Other Ambulatory Visit (HOSPITAL_COMMUNITY): Payer: Self-pay

## 2021-06-02 NOTE — Telephone Encounter (Signed)
Per pharmacy script was picked up 4 days ago

## 2021-07-06 ENCOUNTER — Other Ambulatory Visit (INDEPENDENT_AMBULATORY_CARE_PROVIDER_SITE_OTHER): Payer: BC Managed Care – PPO

## 2021-07-06 ENCOUNTER — Other Ambulatory Visit: Payer: Self-pay

## 2021-07-06 DIAGNOSIS — E559 Vitamin D deficiency, unspecified: Secondary | ICD-10-CM | POA: Diagnosis not present

## 2021-07-06 DIAGNOSIS — E890 Postprocedural hypothyroidism: Secondary | ICD-10-CM

## 2021-07-06 DIAGNOSIS — C73 Malignant neoplasm of thyroid gland: Secondary | ICD-10-CM

## 2021-07-07 LAB — THYROGLOBULIN ANTIBODY: Thyroglobulin Ab: <1 IU/mL (ref <=1)

## 2021-07-07 LAB — THYROGLOBULIN LEVEL: Thyroglobulin: 0.4 ng/mL — ABNORMAL LOW

## 2021-07-07 LAB — VITAMIN D 25 HYDROXY (VIT D DEFICIENCY, FRACTURES): VITD: 40.22 ng/mL (ref 30.00–100.00)

## 2021-07-07 LAB — T4, FREE: Free T4: 1.52 ng/dL (ref 0.60–1.60)

## 2021-07-07 LAB — TSH: TSH: 0.62 u[IU]/mL (ref 0.35–5.50)

## 2021-07-17 ENCOUNTER — Ambulatory Visit
Admission: RE | Admit: 2021-07-17 | Discharge: 2021-07-17 | Disposition: A | Discharge status: Home / Self Care | Payer: BC Managed Care – PPO | Source: Ambulatory Visit | Attending: Internal Medicine | Admitting: Internal Medicine

## 2021-07-17 DIAGNOSIS — C73 Malignant neoplasm of thyroid gland: Secondary | ICD-10-CM

## 2021-08-28 ENCOUNTER — Other Ambulatory Visit (HOSPITAL_COMMUNITY): Payer: Self-pay

## 2021-08-28 ENCOUNTER — Other Ambulatory Visit: Payer: Self-pay | Admitting: Internal Medicine

## 2021-08-28 DIAGNOSIS — E890 Postprocedural hypothyroidism: Secondary | ICD-10-CM

## 2021-08-28 MED ORDER — LEVOTHYROXINE SODIUM 100 MCG PO TABS
100.0000 ug | ORAL_TABLET | Freq: Every day | ORAL | 3 refills | Status: DC
  Filled 2021-08-28: qty 90, 90d supply, fill #0
  Filled 2021-11-23: qty 90, 90d supply, fill #1
  Filled 2022-02-25: qty 90, 90d supply, fill #2

## 2021-08-31 ENCOUNTER — Other Ambulatory Visit (HOSPITAL_COMMUNITY): Payer: Self-pay

## 2021-11-24 ENCOUNTER — Other Ambulatory Visit (HOSPITAL_COMMUNITY): Payer: Self-pay

## 2022-01-21 ENCOUNTER — Telehealth: Payer: Self-pay

## 2022-01-21 NOTE — Telephone Encounter (Signed)
Pt lvm for a call back to schedule an appt.

## 2022-01-27 ENCOUNTER — Other Ambulatory Visit: Payer: Self-pay | Admitting: Internal Medicine

## 2022-01-27 ENCOUNTER — Other Ambulatory Visit (INDEPENDENT_AMBULATORY_CARE_PROVIDER_SITE_OTHER): Payer: BC Managed Care – PPO

## 2022-01-27 DIAGNOSIS — C73 Malignant neoplasm of thyroid gland: Secondary | ICD-10-CM

## 2022-01-28 LAB — VITAMIN D 25 HYDROXY (VIT D DEFICIENCY, FRACTURES): VITD: 35.82 ng/mL (ref 30.00–100.00)

## 2022-01-28 LAB — TSH: TSH: 0.65 u[IU]/mL (ref 0.35–5.50)

## 2022-01-29 LAB — THYROGLOBULIN ANTIBODY: Thyroglobulin Ab: <1 IU/mL (ref <=1)

## 2022-01-29 LAB — THYROGLOBULIN LEVEL: Thyroglobulin: 0.6 ng/mL — ABNORMAL LOW

## 2022-02-02 ENCOUNTER — Telehealth: Payer: Self-pay | Admitting: Internal Medicine

## 2022-02-02 NOTE — Telephone Encounter (Signed)
Patient is calling saying that she received a message from Dr. Cruzita Lederer that she needs to schedule a CT scan.  Patient is calling to say that she is ready to schedule that appointment.

## 2022-02-03 ENCOUNTER — Other Ambulatory Visit: Payer: Self-pay | Admitting: Internal Medicine

## 2022-02-03 ENCOUNTER — Encounter: Payer: Self-pay | Admitting: Internal Medicine

## 2022-02-03 DIAGNOSIS — C73 Malignant neoplasm of thyroid gland: Secondary | ICD-10-CM

## 2022-02-03 NOTE — Telephone Encounter (Signed)
Done

## 2022-02-03 NOTE — Telephone Encounter (Signed)
Pt advised.

## 2022-02-08 ENCOUNTER — Telehealth: Payer: Self-pay | Admitting: Internal Medicine

## 2022-02-08 NOTE — Telephone Encounter (Addendum)
Attempted to contact pt. Voicemail not set up. MyChart message sent to pt.

## 2022-02-08 NOTE — Telephone Encounter (Signed)
Patient is calling to say that she has not heard from the scheduling department at [facility], so she called [phone] and they told her they could not schedule it and gave her another number to call.  When she called the second number they told her the same thing and gave another number and they could not schedule her either.  Patient is getting frustrated and anxious to get the scan done and wants to know what she should do.

## 2022-02-09 NOTE — Written Directive (Cosign Needed Addendum)
MOLECULAR IMAGING AND THERAPEUTICS WRITTEN DIRECTIVE   PATIENT NAME: Heather Glass  PT DOB:   [birth year]                                              MRN: [id number]  ---------------------------------------------------------------------------------------------------------------------  I-131 WHOLE BODY SCAN    RADIOPHARMACEUTICAL: Iodine-131 Capsule for Diagnostic Imaging   PRESCRIBED DOSE FOR ADMINISTRATION: 4 mCi   ROUTE OFADMINISTRATION: PO   DIAGNOSIS: Papillary carcinoma of thyroid   REFERRING PHYSICIAN: Dr. Ferne Reus STIMULATION OR HORMONE WITHDRAW: Thyrogen Stimulated   DATE OF THYROIDECTOMY: 01/10/2017   SURGEON: Dr. Erik Obey   TSH:  9.74 Lab Results  Component Value Date   TSH 0.65 01/27/2022   TSH 0.62 07/06/2021   TSH 7.11 (H) 05/07/2021     PRIOR I-131 THERAPY (Date and Dose):   ADDITIONAL PHYSICIAN COMMENTS/NOTES   AUTHORIZED USER SIGNATURE & TIME STAMP: Rennis Golden, MD   02/15/22    9:13 AM

## 2022-02-15 ENCOUNTER — Encounter (HOSPITAL_COMMUNITY)
Admission: RE | Admit: 2022-02-15 | Discharge: 2022-02-15 | Disposition: A | Discharge status: Home / Self Care | Payer: BC Managed Care – PPO | Source: Ambulatory Visit | Attending: Internal Medicine | Admitting: Internal Medicine

## 2022-02-15 DIAGNOSIS — C73 Malignant neoplasm of thyroid gland: Secondary | ICD-10-CM | POA: Diagnosis present

## 2022-02-15 MED ORDER — THYROTROPIN ALFA 0.9 MG IM SOLR: 0.9000 mg | INTRAMUSCULAR | Status: AC

## 2022-02-15 MED ORDER — THYROTROPIN ALFA 0.9 MG IM SOLR
INTRAMUSCULAR | Status: AC
  Administered 2022-02-15: 0.9 mg via INTRAMUSCULAR
  Filled 2022-02-15: qty 0.9

## 2022-02-16 ENCOUNTER — Encounter (HOSPITAL_COMMUNITY)
Admission: RE | Admit: 2022-02-16 | Discharge: 2022-02-16 | Disposition: A | Discharge status: Home / Self Care | Payer: BC Managed Care – PPO | Source: Ambulatory Visit | Attending: Internal Medicine | Admitting: Internal Medicine

## 2022-02-16 DIAGNOSIS — C73 Malignant neoplasm of thyroid gland: Secondary | ICD-10-CM | POA: Diagnosis not present

## 2022-02-16 MED ORDER — THYROTROPIN ALFA 0.9 MG IM SOLR
0.9000 mg | INTRAMUSCULAR | Status: AC
  Administered 2022-02-16: 0.9 mg via INTRAMUSCULAR

## 2022-02-16 MED ORDER — THYROTROPIN ALFA 0.9 MG IM SOLR
INTRAMUSCULAR | Status: AC
  Filled 2022-02-16: qty 0.9

## 2022-02-17 ENCOUNTER — Encounter (HOSPITAL_COMMUNITY)
Admission: RE | Admit: 2022-02-17 | Discharge: 2022-02-17 | Disposition: A | Discharge status: Home / Self Care | Payer: BC Managed Care – PPO | Source: Ambulatory Visit | Attending: Internal Medicine | Admitting: Internal Medicine

## 2022-02-17 DIAGNOSIS — C73 Malignant neoplasm of thyroid gland: Secondary | ICD-10-CM

## 2022-02-17 LAB — PREGNANCY, URINE: Preg Test, Ur: NEGATIVE

## 2022-02-18 ENCOUNTER — Other Ambulatory Visit (HOSPITAL_COMMUNITY): Payer: Self-pay

## 2022-02-18 MED ORDER — AZITHROMYCIN 250 MG PO TABS
ORAL_TABLET | ORAL | 0 refills | Status: AC
  Filled 2022-02-18: qty 6, 5d supply, fill #0

## 2022-02-19 ENCOUNTER — Encounter (HOSPITAL_COMMUNITY)
Admission: RE | Admit: 2022-02-19 | Discharge: 2022-02-19 | Disposition: A | Discharge status: Home / Self Care | Payer: BC Managed Care – PPO | Source: Ambulatory Visit | Attending: Internal Medicine | Admitting: Internal Medicine

## 2022-02-19 DIAGNOSIS — C73 Malignant neoplasm of thyroid gland: Secondary | ICD-10-CM | POA: Diagnosis not present

## 2022-02-19 MED ORDER — SODIUM IODIDE I 131 CAPSULE
4.1300 | Freq: Once | INTRAVENOUS | Status: AC
  Administered 2022-02-19: 4.13 via ORAL

## 2022-02-26 ENCOUNTER — Other Ambulatory Visit (HOSPITAL_COMMUNITY): Payer: Self-pay

## 2022-05-11 ENCOUNTER — Encounter: Payer: Self-pay | Admitting: Internal Medicine

## 2022-05-11 ENCOUNTER — Ambulatory Visit (INDEPENDENT_AMBULATORY_CARE_PROVIDER_SITE_OTHER): Payer: BC Managed Care – PPO | Admitting: Internal Medicine

## 2022-05-11 VITALS — BP 120/68 | HR 72 | Ht 63.0 in | Wt 159.6 lb

## 2022-05-11 DIAGNOSIS — C73 Malignant neoplasm of thyroid gland: Secondary | ICD-10-CM | POA: Diagnosis not present

## 2022-05-11 DIAGNOSIS — E559 Vitamin D deficiency, unspecified: Secondary | ICD-10-CM | POA: Diagnosis not present

## 2022-05-11 DIAGNOSIS — E890 Postprocedural hypothyroidism: Secondary | ICD-10-CM | POA: Diagnosis not present

## 2022-05-11 NOTE — Patient Instructions (Signed)
Please stop at the lab.  Please continue Levothyroxine 100 mcg every day.  Take the thyroid hormone every day, with water, at least 30 minutes before breakfast, separated by at least 4 hours from: - acid reflux medications - calcium - iron - multivitamins  Please come back for a follow-up appointment in 1 year.

## 2022-05-11 NOTE — Progress Notes (Signed)
Patient ID: Kalman Drape, female   DOB: 1995-09-29, 26 y.o.   MRN: [id number]   HPI  Caralina A Woehr is a pleasant 26 y.o.-year-old female, initially referred by her PCP, Dr. Via presenting for follow-up for thyroid cancer, postsurgical hypothyroidism, postsurgical hypocalcemia, vitamin D deficiency.  Last visit 1 year ago.  Interim history: She denies palpitations, anxiety, insomnia.  She has hand tremors >>  improved. She has chronic headaches but more during the summer.  No headaches now. She started to exercise in 09/2021 >> lost weight (~10 lbs).  Thyroid cancer  - dx'ed in 2018.  Reviewed and addended her cancer history:  Pt's mom noticed a lump in pt's neck >> saw PCP >> Thyroid U/S: large nodules bilaterally R: 2 cm nodule >> FNA AUS/FLUS L: 5.7 cm nodule >> FNA: PTC She had thyroidectomy with central node dissection by Dr. Lazarus Salines in 12/2016. 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   1. Thyroid, lobectomy, Left - PAPILLARY THYROID CARCINOMA, 5 CM. - MARGINS NOT INVOLVED. - CHRONIC LYMPHOCYTIC THYROIDITIS. 2. 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 - NO INVOLVEMENT BY MALIGNANCY. 3. Lymph nodes, regional resection, Central compartment - METASTATIC THYROID CARCINOMA IN TWO OF TWO LYMPH NODES (2/2). - BENIGN THYMUS GLAND. - BENIGN PARATHYROID GLAND. 4. Lymph nodes, regional resection, Left neck L 4 - METASTATIC THYROID CARCINOMA IN FOUR OF FOURTEEN LYMPH NODES (4/14).  Specimen: Thyroid and central compartment and left neck lymph nodes. Procedure (including lymph node sampling if applicable): Thyroidectomy and lymph node sampling. Specimen Integrity (intact/fragmented): Intact. Tumor focality: Unifocal. Dominant tumor: Left lobe. Maximum tumor size (cm): 5 cm. Tumor laterality: Left lobe. Histologic type (including subtype and/or unique  features as applicable): Papillary thyroid carcinoma. Tumor capsule: N/A. Extrathyroidal extension: No. Capsular invasion with degree of invasion if present: N/A. Margins: Free of tumor. Lymphatic or vascular invasion: Present. Lymph nodes: # examined 16; # positive; 6. Extracapsular extension (if applicable): Focal extranodal extension. TNM code: pT3a, pN1b. Non-neoplastic thyroid: Chronic lymphocytic thyroiditis. Comments: The entire left lobe is involved by a 5 cm papillary thyroid carcinoma which does not involve the margins of the specimen.  She developed swelling in her neck.  A neck ultrasound (02/07/2017) showed 2 cysts in the thyroid bed, 1 0.9 cm and one 4.6 cm.  She feels that these have resolved since then.  She saw Dr. Sharl Ma before who ordered RAI treatment (03/16/2017) with 127.3 mCi I-131.    Posttreatment whole body scan (03/28/2017) expected uptake in the thyroid bed and no metastasis.  Neck ultrasound (04/01/2020): Isthmus: Surgically absent. There is no residual nodular soft tissue within the isthmic resection bed.   Right lobe: Surgically absent. There is no residual nodular soft tissue within the right lobectomy resection bed.   Left lobe: Surgically absent. There is no residual nodular soft tissue within the left lobectomy resection bed. ________________________________________________________   Previously identified postop thyroid bed fluid collections have resolved in the interval.   No regional cervical lymphadenopathy.   IMPRESSION: Post total thyroidectomy without evidence of residual or locally recurrent disease.  Tg-stim WBS (02/15/2022):  1. No evidence of local thyroid cancer recurrence in the thyroid bed. 2. No evidence distant metastatic I 131 avid thyroid cancer.  Reviewed her latest thyroglobulin and thyroglobulin antibodies. Lab Results  Component Value Date   THYROGLB 0.6 (L) 01/27/2022   THYROGLB 0.4 (L) 07/06/2021   THYROGLB 1.0 (L)  05/07/2021   THYROGLB 0.5 (L) 03/21/2020   THYROGLB 0.5 (L) 11/18/2017   THGAB <1 01/27/2022   THGAB <1 07/06/2021   THGAB <1 05/07/2021   THGAB <1 03/21/2020   THGAB <1 11/18/2017   Postsurgical hypocalcemia: Lab Results  Component Value Date   PTH 14 (L) 11/18/2017   PTH Comment 11/18/2017   PTH CANCELED 11/18/2017   CALCIUM 8.9 03/21/2020   CALCIUM 9.1 11/18/2017   CALCIUM 9.2 11/18/2017   CALCIUM 8.8 (L) 01/11/2017   CALCIUM 8.6 (L) 01/11/2017   CALCIUM 9.1 01/07/2017   CALCIUM 9.3 06/18/2013   Vitamin D deficiency: Lab Results  Component Value Date   VD25OH 35.82 01/27/2022   VD25OH 40.22 07/06/2021   VD25OH 21.43 (L) 05/07/2021   VD25OH 43.3 07/16/2020   VD25OH 29.9 (L) 06/02/2020   VD25OH 21.8 (L) 03/21/2020   VD25OH 37.77 05/30/2018   VD25OH 17.65 (L) 11/18/2017   On vitamin D 5000 units daily.  Postsurgical hypothyroidism:  Pt is on levothyroxine 100 mcg daily (last dose increase 04/2021): -missed 1 dose in last 1.5 mo - in am - fasting - at least 30 min from b'fast - no Ca, Fe, PPIs - + on Biotin >> stopped since last visit  Reviewed her TFTs: Lab Results  Component Value Date   TSH 0.65 01/27/2022   TSH 0.62 07/06/2021   TSH 7.11 (H) 05/07/2021   TSH 0.80 08/27/2020   TSH 0.09 (L) 07/16/2020   TSH 0.08 (L) 06/02/2020   TSH 1.16 03/21/2020   TSH 0.47 02/23/2019   TSH 0.06 (L) 12/07/2018   TSH 0.69 05/30/2018   FREET4 1.52 07/06/2021   FREET4 1.17 05/07/2021   FREET4 1.35 08/27/2020   FREET4 1.58 07/16/2020   FREET4 1.45 06/02/2020   FREET4 1.28 03/21/2020   FREET4 1.45 02/23/2019   FREET4 1.59 12/07/2018   FREET4 1.50 05/30/2018   FREET4 1.70 (H) 11/18/2017    Pt denies: - feeling nodules in neck - hoarseness - dysphagia - choking  She had 1 episode of panic attack and palpitations before last visit.  An EKG checked by PCP was normal.  She has + FH of thyroid disorders in: MGF, PGF.  + FH of thyroid cancer in PGGF.No FH of  thyroid cancer. No h/o radiation tx to head or neck other than RAI treatment. No herbal supplements. No recent steroids use.   On melatonin. Also on Nexplanon.  ROS: + see HPI  I reviewed pt's medications, allergies, PMH, social hx, family hx, and changes were documented in the history of present illness. Otherwise, unchanged from my initial visit note.  Past Medical History:  Diagnosis Date   Cancer (HCC)    Counseling on health promotion and disease prevention 10/02/2014   Eczema    Headache    Papillary carcinoma of thyroid (HCC)    Strep pharyngitis    Wrist fracture, right    Past Surgical History:  Procedure Laterality Date   THYROIDECTOMY N/A 01/10/2017   Procedure: THYROIDECTOMY CENTRAL COMPARTMENT AND LEFT LEVEL 4 NECK DISECTION;  Surgeon: Flo Shanks, MD;  Location: [facility] OR;  Service: ENT;  Laterality: N/A;   Social History   Socioeconomic History   Marital status: Single    Spouse name: Not on file   Number of children: 0   Years of education: Not on file   Highest education level: Not on file  Occupational History    College student  Tobacco Use   Smoking status: Never Smoker   Smokeless  tobacco: Never Used  Substance and Sexual Activity   Alcohol use: No   Drug use: No   Allergies  Allergen Reactions   Penicillins Hives    Has patient had a PCN reaction causing immediate rash, facial/tongue/throat swelling, SOB or lightheadedness with hypotension: Yes Has patient had a PCN reaction causing severe rash involving mucus membranes or skin necrosis: Yes Has patient had a PCN reaction that required hospitalization: Yes Has patient had a PCN reaction occurring within the last 10 years: No Childhood reaction If all of the above answers are "NO", then may proceed with Cephalosporin use.    Amoxicillin Hives   Food Other (See Comments)    PEAS-RASH   Pecan Nut (Diagnostic) Swelling and Rash    Throat swelling    Family History  Problem Relation Age of  Onset   Hypertension Father    Diabetes Father    Eczema Sister    Diabetes Maternal Grandfather    Hypertension Maternal Grandfather    Heart disease Maternal Grandfather    Hypertension Paternal Grandmother    Diabetes Paternal Grandfather    PE: BP 120/68 (BP Location: Right Arm, Patient Position: Sitting, Cuff Size: Normal)   Pulse 72   Ht 5\' 3"  (1.6 m)   Wt 159 lb 9.6 oz (72.4 kg)   SpO2 99%   BMI 28.27 kg/m  Wt Readings from Last 3 Encounters:  05/11/22 159 lb 9.6 oz (72.4 kg)  05/07/21 170 lb 6.4 oz (77.3 kg)  03/21/20 158 lb 3.2 oz (71.8 kg)   Constitutional: slightly overweight, in NAD Eyes:  EOMI, no exophthalmos ENT: no neck masses, no cervical lymphadenopathy Cardiovascular: RRR, No MRG Respiratory: CTA B Musculoskeletal: no deformities Skin:no rashes Neurological: no tremor with outstretched hands  ASSESSMENT: 1. Thyroid cancer - see HPI  2. Postsurgical Hypothyroidism  3.  Postsurgical hypocalcemia  4.  Vitamin D deficiency  PLAN:  1.  Metastatic thyroid cancer - papillary -Patient has clinical stage I thyroid cancer due to age at diagnosis.  Pathologically, this has been stage III TNM due to the size of the tumor, 5 cm, and metastases in neck lymph nodes.  RAI was indicated for postop thyroid remnant ablation and she had this in 02/2017.  We did discuss in the past that the main role for the RAI treatment is to facilitate monitoring in the long run, by checking thyroglobulin.  In her case, though, it can also help with lymph node metastasis.  A whole-body scan after RAI treatment was negative for distant metastasis. -At last visit, thyroglobulin level was very low (and improved at last 2 checks earlier in the year) and antithyroglobulin antibodies were undetectable -Before last visit, we could not check imaging tests in the previous 2 years due to the coronavirus pandemic.  Also, a whole-body scan was expensive for her in the past.  Since last visit, we  were able to check an ultrasound and a whole-body scan with Thyrogen and none of these showed any cancer recurrence or suspicious masses. -At today's visit, will repeat her Tg + ATA - if Tg increases, may need a PET scan -I will see her back in 1 year  2. Patient with history of thyroidectomy for thyroid cancer, now with iatrogenic hypothyroidism, on levothyroxine therapy. - latest thyroid labs reviewed with pt. >> normal, at goal, after increasing her levothyroxine dose after last visit: Lab Results  Component Value Date   TSH 0.65 01/27/2022  - she continues on LT4 100 mcg daily -  pt feels good on this dose. - we discussed about taking the thyroid hormone every day, with water, >30 minutes before breakfast, separated by >4 hours from acid reflux medications, calcium, iron, multivitamins. Pt. is taking it correctly. - will check thyroid tests today: TSH and fT4 - If labs are abnormal, she will need to return for repeat TFTs in 1.5 months -At last visit, she inquired about management of levothyroxine during pregnancy.  No immediate plans for pregnancy.  We discussed about the need to increase the dose during pregnancy and change in the testing interval.  3. Postsurgical hypocalcemia -She had a slightly low calcium after surgery but this normalized in 10/2017 and remained normal afterwards: Lab Results  Component Value Date   CALCIUM 8.9 03/21/2020  -She denies perioral numbness or tingling or acral cramping  4.  Vitamin D deficiency -At last visit, vitamin D was low, at 17.65 and we started her on 5000 units vitamin D daily but was off at last OV -at last OV, vit D was low, so we restarted 5000 units daily -next levels were normal in 07/2021 and 12/2021  Component     Latest Ref Rng 05/11/2022  TSH     0.35 - 5.50 uIU/mL 2.40   T4,Free(Direct)     0.60 - 1.60 ng/dL 8.11   Thyroglobulin     ng/mL 0.5 (L)   Comment --   Thyroglobulin Ab     < or = 1 IU/mL <1   Tg is slightly  lower than before, however, TSH is above target.  I will advise him to increase the levothyroxine dose to 112 mcg daily and repeat the test in 1.5 months.  Carlus Pavlov, MD PhD [facility] Endocrinology

## 2022-05-12 LAB — TSH: TSH: 2.4 u[IU]/mL (ref 0.35–5.50)

## 2022-05-12 LAB — T4, FREE: Free T4: 1.22 ng/dL (ref 0.60–1.60)

## 2022-05-13 ENCOUNTER — Other Ambulatory Visit (HOSPITAL_COMMUNITY): Payer: Self-pay

## 2022-05-13 LAB — THYROGLOBULIN LEVEL: Thyroglobulin: 0.5 ng/mL — ABNORMAL LOW

## 2022-05-13 LAB — THYROGLOBULIN ANTIBODY: Thyroglobulin Ab: <1 IU/mL (ref <=1)

## 2022-05-13 MED ORDER — LEVOTHYROXINE SODIUM 112 MCG PO TABS
112.0000 ug | ORAL_TABLET | Freq: Every day | ORAL | 5 refills | Status: DC
  Filled 2022-05-13: qty 45, 45d supply, fill #0
  Filled 2022-06-22: qty 45, 45d supply, fill #1

## 2022-06-22 ENCOUNTER — Other Ambulatory Visit (HOSPITAL_COMMUNITY): Payer: Self-pay

## 2022-06-28 ENCOUNTER — Other Ambulatory Visit (INDEPENDENT_AMBULATORY_CARE_PROVIDER_SITE_OTHER): Payer: BC Managed Care – PPO

## 2022-06-28 DIAGNOSIS — E890 Postprocedural hypothyroidism: Secondary | ICD-10-CM

## 2022-06-29 LAB — TSH: TSH: 0.21 u[IU]/mL — ABNORMAL LOW (ref 0.35–5.50)

## 2022-06-29 LAB — T4, FREE: Free T4: 1.43 ng/dL (ref 0.60–1.60)

## 2022-06-29 MED ORDER — LEVOTHYROXINE SODIUM 112 MCG PO TABS: 112.0000 ug | ORAL_TABLET | ORAL | 5 refills | Status: AC

## 2022-06-29 MED ORDER — LEVOTHYROXINE SODIUM 100 MCG PO TABS: 100.0000 ug | ORAL_TABLET | ORAL | 3 refills | Status: AC

## 2022-08-16 ENCOUNTER — Other Ambulatory Visit (HOSPITAL_COMMUNITY): Payer: Self-pay

## 2022-08-16 ENCOUNTER — Other Ambulatory Visit: Payer: Self-pay | Admitting: Internal Medicine

## 2022-08-16 DIAGNOSIS — E890 Postprocedural hypothyroidism: Secondary | ICD-10-CM

## 2022-08-17 ENCOUNTER — Other Ambulatory Visit (HOSPITAL_COMMUNITY): Payer: Self-pay

## 2022-08-18 ENCOUNTER — Other Ambulatory Visit (HOSPITAL_COMMUNITY): Payer: Self-pay

## 2022-08-18 MED ORDER — LEVOTHYROXINE SODIUM 112 MCG PO TABS
112.0000 ug | ORAL_TABLET | Freq: Every morning | ORAL | 11 refills | Status: AC
  Filled 2022-08-18: qty 30, 30d supply, fill #0

## 2022-08-18 MED ORDER — LEVOTHYROXINE SODIUM 112 MCG PO TABS
112.0000 ug | ORAL_TABLET | Freq: Every morning | ORAL | 11 refills | Status: DC
  Filled 2022-08-18 – 2022-09-12 (×2): qty 90, 90d supply, fill #0
  Filled 2022-12-22: qty 90, 90d supply, fill #1
  Filled 2023-03-21: qty 90, 90d supply, fill #2
  Filled 2023-06-20: qty 90, 90d supply, fill #3

## 2022-09-13 ENCOUNTER — Other Ambulatory Visit (HOSPITAL_COMMUNITY): Payer: Self-pay

## 2022-11-29 ENCOUNTER — Other Ambulatory Visit (HOSPITAL_COMMUNITY): Payer: Self-pay

## 2022-11-29 MED ORDER — PREDNISOLONE SODIUM PHOSPHATE 15 MG/5ML PO SOLN
5.0000 mL | OROMUCOSAL | 0 refills | Status: AC | PRN
  Filled 2022-11-29 (×2): qty 120, 2d supply, fill #0

## 2023-03-21 ENCOUNTER — Other Ambulatory Visit (HOSPITAL_COMMUNITY): Payer: Self-pay

## 2023-05-13 ENCOUNTER — Ambulatory Visit: Payer: BC Managed Care – PPO | Admitting: Internal Medicine

## 2023-06-20 ENCOUNTER — Other Ambulatory Visit (HOSPITAL_COMMUNITY): Payer: Self-pay

## 2023-07-25 ENCOUNTER — Other Ambulatory Visit (HOSPITAL_COMMUNITY): Payer: Self-pay | Admitting: Endocrinology

## 2023-07-25 DIAGNOSIS — C73 Malignant neoplasm of thyroid gland: Secondary | ICD-10-CM

## 2023-08-02 NOTE — Written Directive (Addendum)
 I-131 WHOLE BODY SCAN    RADIOPHARMACEUTICAL: Iodine-131 Capsule for Diagnostic Imaging   PRESCRIBED DOSE FOR ADMINISTRATION: 4 mCi   ROUTE OFADMINISTRATION: PO   DIAGNOSIS:   Papillary thyroid carcinoma    REFERRING PHYSICIAN: Dorisann Frames, MD   THYROGEN STIMULATION OR HORMONE WITHDRAW: Thyrogen Stimulating   DATE OF THYROIDECTOMY:   01/10/17   SURGEON: Flo Shanks, MD    TSH:   Lab Results  Component Value Date   TSH 0.21 (L) 06/28/2022   TSH 2.40 05/11/2022   TSH 0.65 01/27/2022     PRIOR I-131 THERAPY (Date and Dose): 03/16/2017      127.3 mCi  ADDITIONAL PHYSICIAN COMMENTS/NOTES   AUTHORIZED USER SIGNATURE & TIME STAMP: Patriciaann Clan, MD   08/02/23    1:43 PM

## 2023-08-15 ENCOUNTER — Encounter (HOSPITAL_COMMUNITY)
Admission: RE | Admit: 2023-08-15 | Discharge: 2023-08-15 | Disposition: A | Discharge status: Home / Self Care | Payer: Self-pay | Source: Ambulatory Visit | Attending: Endocrinology | Admitting: Endocrinology

## 2023-08-15 DIAGNOSIS — C73 Malignant neoplasm of thyroid gland: Secondary | ICD-10-CM | POA: Insufficient documentation

## 2023-08-15 MED ORDER — THYROTROPIN ALFA 0.9 MG IM SOLR
0.9000 mg | INTRAMUSCULAR | Status: AC
  Administered 2023-08-15: 0.9 mg via INTRAMUSCULAR

## 2023-08-16 ENCOUNTER — Encounter (HOSPITAL_COMMUNITY)
Admission: RE | Admit: 2023-08-16 | Discharge: 2023-08-16 | Disposition: A | Discharge status: Home / Self Care | Payer: Self-pay | Source: Ambulatory Visit | Attending: Endocrinology | Admitting: Endocrinology

## 2023-08-16 DIAGNOSIS — C73 Malignant neoplasm of thyroid gland: Secondary | ICD-10-CM | POA: Diagnosis not present

## 2023-08-16 MED ORDER — THYROTROPIN ALFA 0.9 MG IM SOLR
0.9000 mg | INTRAMUSCULAR | Status: AC
  Administered 2023-08-16: 0.9 mg via INTRAMUSCULAR

## 2023-08-16 MED ORDER — THYROTROPIN ALFA 0.9 MG IM SOLR
INTRAMUSCULAR | Status: AC
  Filled 2023-08-16: qty 0.9

## 2023-08-17 ENCOUNTER — Encounter (HOSPITAL_COMMUNITY)
Admission: RE | Admit: 2023-08-17 | Discharge: 2023-08-17 | Disposition: A | Discharge status: Home / Self Care | Payer: Self-pay | Source: Ambulatory Visit | Attending: Endocrinology | Admitting: Endocrinology

## 2023-08-17 DIAGNOSIS — C73 Malignant neoplasm of thyroid gland: Secondary | ICD-10-CM | POA: Diagnosis not present

## 2023-08-17 LAB — PREGNANCY, URINE: Preg Test, Ur: NEGATIVE

## 2023-08-17 MED ORDER — SODIUM IODIDE I 131 CAPSULE
3.9000 | Freq: Once | INTRAVENOUS | Status: AC
  Administered 2023-08-17: 3.9 via ORAL

## 2023-08-19 ENCOUNTER — Encounter (HOSPITAL_COMMUNITY)
Admission: RE | Admit: 2023-08-19 | Discharge: 2023-08-19 | Disposition: A | Discharge status: Home / Self Care | Payer: Self-pay | Source: Ambulatory Visit | Attending: Endocrinology | Admitting: Endocrinology

## 2023-08-19 DIAGNOSIS — C73 Malignant neoplasm of thyroid gland: Secondary | ICD-10-CM | POA: Diagnosis not present

## 2023-09-20 ENCOUNTER — Other Ambulatory Visit (HOSPITAL_COMMUNITY): Payer: Self-pay

## 2023-09-20 MED ORDER — LEVOTHYROXINE SODIUM 112 MCG PO TABS
112.0000 ug | ORAL_TABLET | Freq: Every morning | ORAL | 5 refills | Status: AC
  Filled 2023-09-20: qty 90, 90d supply, fill #0

## 2023-09-20 MED ORDER — LEVOTHYROXINE SODIUM 112 MCG PO TABS
112.0000 ug | ORAL_TABLET | Freq: Every morning | ORAL | 5 refills | Status: AC
  Filled 2023-09-20: qty 90, 90d supply, fill #0
  Filled 2023-12-28: qty 90, 90d supply, fill #1
  Filled 2024-03-29: qty 90, 90d supply, fill #2
  Filled 2024-06-27: qty 90, 90d supply, fill #3
